# Patient Record
Sex: Female | Born: 1985
Health system: Southern US, Community
[De-identification: ages and names within clinical notes are randomized; demographics above are authoritative.]

## PROBLEM LIST (undated history)

## (undated) HISTORY — PX: TUBAL LIGATION: SHX77

---

## 2011-11-05 ENCOUNTER — Emergency Department (HOSPITAL_BASED_OUTPATIENT_CLINIC_OR_DEPARTMENT_OTHER)
Admission: EM | Admit: 2011-11-05 | Discharge: 2011-11-05 | Disposition: A | Discharge status: Home / Self Care | Payer: Self-pay | Attending: Emergency Medicine | Admitting: Emergency Medicine

## 2011-11-05 ENCOUNTER — Encounter (HOSPITAL_BASED_OUTPATIENT_CLINIC_OR_DEPARTMENT_OTHER): Payer: Self-pay | Admitting: *Deleted

## 2011-11-05 DIAGNOSIS — T148XXA Other injury of unspecified body region, initial encounter: Secondary | ICD-10-CM

## 2011-11-05 DIAGNOSIS — F17200 Nicotine dependence, unspecified, uncomplicated: Secondary | ICD-10-CM | POA: Insufficient documentation

## 2011-11-05 DIAGNOSIS — Y998 Other external cause status: Secondary | ICD-10-CM | POA: Insufficient documentation

## 2011-11-05 DIAGNOSIS — S41109A Unspecified open wound of unspecified upper arm, initial encounter: Secondary | ICD-10-CM | POA: Insufficient documentation

## 2011-11-05 DIAGNOSIS — Y9383 Activity, rough housing and horseplay: Secondary | ICD-10-CM | POA: Insufficient documentation

## 2011-11-05 DIAGNOSIS — W268XXA Contact with other sharp object(s), not elsewhere classified, initial encounter: Secondary | ICD-10-CM | POA: Insufficient documentation

## 2011-11-05 MED ORDER — IBUPROFEN 800 MG PO TABS
800.0000 mg | ORAL_TABLET | Freq: Once | ORAL | Status: AC
  Administered 2011-11-05: 800 mg via ORAL
  Filled 2011-11-05: qty 1

## 2011-11-05 MED ORDER — TETANUS-DIPHTH-ACELL PERTUSSIS 5-2.5-18.5 LF-MCG/0.5 IM SUSP
0.5000 mL | Freq: Once | INTRAMUSCULAR | Status: AC
  Administered 2011-11-05: 0.5 mL via INTRAMUSCULAR
  Filled 2011-11-05: qty 0.5

## 2011-11-05 NOTE — ED Provider Notes (Signed)
History     CSN: [id number]  Arrival date & time 11/05/11  2103   First MD Initiated Contact with Patient 11/05/11 2204      Chief Complaint  Patient presents with  . Puncture Wound    (Consider location/radiation/quality/duration/timing/severity/associated sxs/prior treatment) HPI Comments: Pt states that she was playing around with a friend and there was a nail sticking out of the door and she scraped her left upper arm with the nail:pt denies and pieces being in the area:pt unsure of last tetanus  The history is provided by the patient. No language interpreter was used.    History reviewed. No pertinent past medical history.  History reviewed. No pertinent past surgical history.  No family history on file.  History  Substance Use Topics  . Smoking status: Current Everyday Smoker -- 0.5 packs/day  . Smokeless tobacco: Not on file  . Alcohol Use: Yes    OB History    Grav Para Term Preterm Abortions TAB SAB Ect Mult Living                  Review of Systems  Constitutional: Negative.   Respiratory: Negative.   Skin: Positive for wound.    Allergies  Review of patient's allergies indicates no known allergies.  Home Medications   Current Outpatient Rx  Name Route Sig Dispense Refill  . MEDROXYPROGESTERONE ACETATE 150 MG/ML IM SUSP Intramuscular Inject 150 mg into the muscle every 3 (three) months.      BP 110/63  Pulse 65  Temp(Src) 98.6 F (37 C) (Oral)  Resp 20  SpO2 100%  Physical Exam  Nursing note and vitals reviewed. Constitutional: She appears well-developed and well-nourished.  Cardiovascular: Normal rate and regular rhythm.   Pulmonary/Chest: Effort normal and breath sounds normal.  Musculoskeletal: Normal range of motion.  Skin:       Pt has a small puncture wound to the left upper arm    ED Course  Procedures (including critical care time)  Labs Reviewed - No data to display No results found.   1. Puncture wound       MDM    There is nothing to suture at this time:no fb noted:wound cleaned and dressed:pt updated on tetanus       Teressa Lower, NP 11/05/11 2215
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
## 2011-11-08 NOTE — ED Provider Notes (Signed)
Medical screening examination/treatment/procedure(s) were performed by non-physician practitioner and as supervising physician I was immediately available for consultation/collaboration.  Zaylon Bossier, MD 11/08/11 0439 

## 2014-03-19 ENCOUNTER — Emergency Department (HOSPITAL_BASED_OUTPATIENT_CLINIC_OR_DEPARTMENT_OTHER)
Admission: EM | Admit: 2014-03-19 | Discharge: 2014-03-19 | Disposition: A | Discharge status: Home / Self Care | Payer: No Typology Code available for payment source | Attending: Emergency Medicine | Admitting: Emergency Medicine

## 2014-03-19 ENCOUNTER — Encounter (HOSPITAL_BASED_OUTPATIENT_CLINIC_OR_DEPARTMENT_OTHER): Payer: Self-pay | Admitting: Emergency Medicine

## 2014-03-19 ENCOUNTER — Emergency Department (HOSPITAL_BASED_OUTPATIENT_CLINIC_OR_DEPARTMENT_OTHER): Payer: No Typology Code available for payment source

## 2014-03-19 DIAGNOSIS — S39012A Strain of muscle, fascia and tendon of lower back, initial encounter: Secondary | ICD-10-CM

## 2014-03-19 DIAGNOSIS — Z720 Tobacco use: Secondary | ICD-10-CM | POA: Insufficient documentation

## 2014-03-19 DIAGNOSIS — S0990XA Unspecified injury of head, initial encounter: Secondary | ICD-10-CM | POA: Insufficient documentation

## 2014-03-19 DIAGNOSIS — Y92410 Unspecified street and highway as the place of occurrence of the external cause: Secondary | ICD-10-CM | POA: Insufficient documentation

## 2014-03-19 DIAGNOSIS — Y9389 Activity, other specified: Secondary | ICD-10-CM | POA: Insufficient documentation

## 2014-03-19 DIAGNOSIS — S161XXA Strain of muscle, fascia and tendon at neck level, initial encounter: Secondary | ICD-10-CM

## 2014-03-19 DIAGNOSIS — Z3202 Encounter for pregnancy test, result negative: Secondary | ICD-10-CM | POA: Insufficient documentation

## 2014-03-19 LAB — PREGNANCY, URINE: Preg Test, Ur: NEGATIVE
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
## 2014-03-19 NOTE — Discharge Instructions (Signed)
Ibuprofen 600 mg every 6 hours as needed for pain.  Follow-up with your primary doctor if not improving in the next week.   Cervical Sprain A cervical sprain is when the tissues (ligaments) that hold the neck bones in place stretch or tear. HOME CARE   Put ice on the injured area.  Put ice in a plastic bag.  Place a towel between your skin and the bag.  Leave the ice on for 15-20 minutes, 3-4 times a day.  You may have been given a collar to wear. This collar keeps your neck from moving while you heal.  Do not take the collar off unless told by your doctor.  If you have long hair, keep it outside of the collar.  Ask your doctor before changing the position of your collar. You may need to change its position over time to make it more comfortable.  If you are allowed to take off the collar for cleaning or bathing, follow your doctor's instructions on how to do it safely.  Keep your collar clean by wiping it with mild soap and water. Dry it completely. If the collar has removable pads, remove them every 1-2 days to hand wash them with soap and water. Allow them to air dry. They should be dry before you wear them in the collar.  Do not drive while wearing the collar.  Only take medicine as told by your doctor.  Keep all doctor visits as told.  Keep all physical therapy visits as told.  Adjust your work station so that you have good posture while you work.  Avoid positions and activities that make your problems worse.  Warm up and stretch before being active. GET HELP IF:  Your pain is not controlled with medicine.  You cannot take less pain medicine over time as planned.  Your activity level does not improve as expected. GET HELP RIGHT AWAY IF:   You are bleeding.  Your stomach is upset.  You have an allergic reaction to your medicine.  You develop new problems that you cannot explain.  You lose feeling (become numb) or you cannot move any part of your body  (paralysis).  You have tingling or weakness in any part of your body.  Your symptoms get worse. Symptoms include:  Pain, soreness, stiffness, puffiness (swelling), or a burning feeling in your neck.  Pain when your neck is touched.  Shoulder or upper back pain.  Limited ability to move your neck.  Headache.  Dizziness.  Your hands or arms feel week, lose feeling, or tingle.  Muscle spasms.  Difficulty swallowing or chewing. MAKE SURE YOU:   Understand these instructions.  Will watch your condition.  Will get help right away if you are not doing well or get worse. Document Released: 10/30/2007 Document Revised: 01/13/2013 Document Reviewed: 11/18/2012 [facility] Patient Information 2015 [place], [place]. This information is not intended to replace advice given to you by your health care provider. Make sure you discuss any questions you have with your health care provider.  Lumbosacral Strain Lumbosacral strain is a strain of any of the parts that make up your lumbosacral vertebrae. Your lumbosacral vertebrae are the bones that make up the lower third of your backbone. Your lumbosacral vertebrae are held together by muscles and tough, fibrous tissue (ligaments).  CAUSES  A sudden blow to your back can cause lumbosacral strain. Also, anything that causes an excessive stretch of the muscles in the low back can cause this strain. This is  typically seen when people exert themselves strenuously, fall, lift heavy objects, bend, or crouch repeatedly. RISK FACTORS  Physically demanding work.  Participation in pushing or pulling sports or sports that require a sudden twist of the back (tennis, golf, baseball).  Weight lifting.  Excessive lower back curvature.  Forward-tilted pelvis.  Weak back or abdominal muscles or both.  Tight hamstrings. SIGNS AND SYMPTOMS  Lumbosacral strain may cause pain in the area of your injury or pain that moves (radiates) down your leg.   DIAGNOSIS Your health care provider can often diagnose lumbosacral strain through a physical exam. In some cases, you may need tests such as X-ray exams.  TREATMENT  Treatment for your lower back injury depends on many factors that your clinician will have to evaluate. However, most treatment will include the use of anti-inflammatory medicines. HOME CARE INSTRUCTIONS   Avoid hard physical activities (tennis, racquetball, waterskiing) if you are not in proper physical condition for it. This may aggravate or create problems.  If you have a back problem, avoid sports requiring sudden body movements. Swimming and walking are generally safer activities.  Maintain good posture.  Maintain a healthy weight.  For acute conditions, you may put ice on the injured area.  Put ice in a plastic bag.  Place a towel between your skin and the bag.  Leave the ice on for 20 minutes, 2-3 times a day.  When the low back starts healing, stretching and strengthening exercises may be recommended. SEEK MEDICAL CARE IF:  Your back pain is getting worse.  You experience severe back pain not relieved with medicines. SEEK IMMEDIATE MEDICAL CARE IF:   You have numbness, tingling, weakness, or problems with the use of your arms or legs.  There is a change in bowel or bladder control.  You have increasing pain in any area of the body, including your belly (abdomen).  You notice shortness of breath, dizziness, or feel faint.  You feel sick to your stomach (nauseous), are throwing up (vomiting), or become sweaty.  You notice discoloration of your toes or legs, or your feet get very cold. MAKE SURE YOU:   Understand these instructions.  Will watch your condition.  Will get help right away if you are not doing well or get worse. Document Released: 02/20/2005 Document Revised: 05/18/2013 Document Reviewed: 12/30/2012 [facility] Patient Information 2015 [place], [place]. This information is not intended to  replace advice given to you by your health care provider. Make sure you discuss any questions you have with your health care provider.
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
## 2014-03-19 NOTE — ED Notes (Signed)
Pt discharged to home with family. NAD.  

## 2014-07-28 ENCOUNTER — Encounter (HOSPITAL_BASED_OUTPATIENT_CLINIC_OR_DEPARTMENT_OTHER): Payer: Self-pay

## 2014-07-28 ENCOUNTER — Emergency Department (HOSPITAL_BASED_OUTPATIENT_CLINIC_OR_DEPARTMENT_OTHER)
Admission: EM | Admit: 2014-07-28 | Discharge: 2014-07-28 | Disposition: A | Discharge status: Home / Self Care | Payer: Self-pay | Attending: Emergency Medicine | Admitting: Emergency Medicine

## 2014-07-28 DIAGNOSIS — Z720 Tobacco use: Secondary | ICD-10-CM | POA: Insufficient documentation

## 2014-07-28 DIAGNOSIS — J111 Influenza due to unidentified influenza virus with other respiratory manifestations: Secondary | ICD-10-CM | POA: Insufficient documentation

## 2014-07-28 DIAGNOSIS — R69 Illness, unspecified: Secondary | ICD-10-CM

## 2014-07-28 LAB — CBG MONITORING, ED: Glucose-Capillary: 77 mg/dL (ref 70–99)

## 2014-07-28 MED ORDER — ACETAMINOPHEN 500 MG PO TABS
1000.0000 mg | ORAL_TABLET | Freq: Once | ORAL | Status: AC
  Administered 2014-07-28: 1000 mg via ORAL
  Filled 2014-07-28: qty 2

## 2014-07-28 NOTE — ED Notes (Signed)
MD at bedside. 

## 2014-07-28 NOTE — ED Provider Notes (Signed)
CSN: [id number]     Arrival date & time 07/28/14  [id number] History   First MD Initiated Contact with Patient 07/28/14 [phone]     Chief Complaint  Patient presents with  . Generalized Body Aches     (Consider location/radiation/quality/duration/timing/severity/associated sxs/prior Treatment) HPI Complains of left-sided frontoparietal headache typical of migraine, gradual onset, generalized weakness, duct of cough, diffuse myalgias onset yesterday. Treated with NyQuil last night, without relief. No shortness of breath no abdominal pain no nausea or vomiting no fever no other associated symptoms. Nothing makes symptoms better or worse. History reviewed. No pertinent past medical history. past medical history negative History reviewed. No pertinent past surgical history. No family history on file. History  Substance Use Topics  . Smoking status: Current Every Day Smoker -- 0.50 packs/day  . Smokeless tobacco: Not on file  . Alcohol Use: Yes   positive marijuana use OB History    No data available     Review of Systems  Respiratory: Positive for cough. Negative for shortness of breath.   Genitourinary:       Currently on menses  Musculoskeletal: Positive for myalgias.  Neurological: Positive for weakness and headaches.  All other systems reviewed and are negative.     Allergies  Review of patient's allergies indicates no known allergies.  Home Medications   Prior to Admission medications   Medication Sig Start Date End Date Taking? Authorizing Provider  medroxyPROGESTERone (DEPO-PROVERA) 150 MG/ML injection Inject 150 mg into the muscle every 3 (three) months.    Historical Provider, MD   BP 104/77 mmHg  Pulse 64  Temp(Src) 98.5 F (36.9 C) (Oral)  Resp 16  Ht 5\' 4"  (1.626 m)  Wt 154 lb (69.854 kg)  BMI 26.42 kg/m2  SpO2 100%  LMP 07/26/2014 Physical Exam  Constitutional: She appears well-developed and well-nourished. No distress.  HENT:  Head: Normocephalic and  atraumatic.  Right Ear: External ear normal.  Left Ear: External ear normal.  Mouth/Throat: No oropharyngeal exudate.  Oral pharynx minimally reddened, nasal congestion  Eyes: Conjunctivae are normal. Pupils are equal, round, and reactive to light.  Neck: Neck supple. No tracheal deviation present. No thyromegaly present.  Cardiovascular: Normal rate and regular rhythm.   No murmur heard. Pulmonary/Chest: Effort normal and breath sounds normal.  Abdominal: Soft. Bowel sounds are normal. She exhibits no distension. There is no tenderness.  Musculoskeletal: Normal range of motion. She exhibits no edema or tenderness.  Neurological: She is alert. Coordination normal.  Skin: Skin is warm and dry. No rash noted.  Psychiatric: She has a normal mood and affect.  Nursing note and vitals reviewed.   ED Course  Procedures (including critical care time) Labs Review Labs Reviewed - No data to display  Imaging Review No results found.   EKG Interpretation None     Counseled patient for 5 minutes on smoking cessation Results for orders placed or performed during the hospital encounter of 07/28/14  CBG monitoring, ED  Result Value Ref Range   Glucose-Capillary 77 70 - 99 mg/dL   No results found.  MDM  8:05 AM patient looks well. Alert and ambulatory. Suspect viral illness. Final diagnoses:  None   plan encourage oral hydration. Tylenol for fever or aches. Referral [facility] and wellness and resource guide Diagnosis #1 influenza-like illness #2 tobacco abuse      Doug SouSam Kiaya [place], MD 07/28/14 [phone]

## 2014-07-28 NOTE — Discharge Instructions (Signed)
Take Tylenol every 4 hours as directed for aches or for temperature higher than 100.4 while awake. Make sure that you drink lots of fluids. Drink at least six 8 ounce glasses of water or Gatorade each day. Call the [facility] health and wellness Center or any of the numbers on the resource guide to get a primary care physician today. See your new primary care physician if you're not feeling better by next week. Return if you feel worse for any reason. Ask your new doctor to help you to stop smoking.  Emergency Department Resource Guide 1) Find a Doctor and Pay Out of Pocket Although you won't have to find out who is covered by your insurance plan, it is a good idea to ask around and get recommendations. You will then need to call the office and see if the doctor you have chosen will accept you as a new patient and what types of options they offer for patients who are self-pay. Some doctors offer discounts or will set up payment plans for their patients who do not have insurance, but you will need to ask so you aren't surprised when you get to your appointment.  2) Contact Your Local Health Department Not all health departments have doctors that can see patients for sick visits, but many do, so it is worth a call to see if yours does. If you don't know where your local health department is, you can check in your phone book. The CDC also has a tool to help you locate your state's health department, and many state websites also have listings of all of their local health departments.  3) Find a Walk-in Clinic If your illness is not likely to be very severe or complicated, you may want to try a walk in clinic. These are popping up all over the country in pharmacies, drugstores, and shopping centers. They're usually staffed by nurse practitioners or physician assistants that have been trained to treat common illnesses and complaints. They're usually fairly quick and inexpensive. However, if you have serious  medical issues or chronic medical problems, these are probably not your best option.  No Primary Care Doctor: - Call Health Connect at  [phone] - they can help you locate a primary care doctor that  accepts your insurance, provides certain services, etc. - Physician Referral Service- [phone]  Chronic Pain Problems: Organization         Address  Phone   Notes  [facility]  [phone] Patients need to be referred by their primary care doctor.   Medication Assistance: Organization         Address  Phone   Notes  [facility] Medication [facility] [address] [place], [place] [address] [phone] --Must be a resident of [facility] -- Must have NO insurance coverage whatsoever (no Medicaid/ Medicare, etc.) -- The pt. MUST have a primary care doctor that directs their care regularly and follows them in the community   [facility]  [phone]   [employer]  [phone]    Agencies that provide inexpensive medical care: Organization         Address  Phone   Notes  [facility]  [phone]   [facility]    [phone]   [facility] [address] [place], [place] [address] [phone]   [facility] [address] [place], [place] [phone]  Planned Parenthood    [phone](336) [phone]   [facility]    [phone]) [phone]   [facility] and [facility]  [address], [place] Phone:  [phone], Fax:  [phone] Hours of Operation:  9 am - 6 pm, M-F.  Also accepts Medicaid/Medicare and self-pay.  [facility]  [address], [place] Phone: [phone](336) [phone], Fax: [phone]) [phone]. Hours of Operation:  8:30 am - 5:30 pm, M-F.  Also accepts Medicaid and self-pay.  [facility] [place] [address], [place] Phone: [phone]) [phone]   [facility] [address], [place], [place] [phone], Ext. [phone] Mondays & Thursdays: 7-9 AM.  First 15 patients are seen on a first come, first serve basis.    Medicaid-accepting [place] Providers:  Organization         Address  Phone   Notes  [facility] [address], [place] [phone] Also accepts self-pay patients.  [facility] [address], [place]  [phone]) [phone]   [facility] [address], [place] [phone]) [phone]   [facility] [address] [place] [address], [place] [phone]) [phone]   Renaye RakersVeita Bland [address], [place]   [phone]) [phone] Only accepts [place] Access [place] patients after they have their name applied to their card.   Self-Pay (no insurance) in [facility]:  Organization         Address  Phone   Notes  Sickle Cell Patients, [facility] [address], [place] [phone]) [phone]   [facility] [address], [place] [phone]   [facility]  [address], [place] [phone]) [phone]   [facility]/Dr. Osei-Bonsu  [address] [place] [address], [place] or [phone], [place] [phone]) [phone] Phone number for both [place] and [place] locations is the same.  [facility] [address], [place] [phone]) [phone]   [facility]  [address] [place] [address], [place] or [address] [phone]) [phone]   [facility] [address], [place] [phone]) [phone], phone; [phone]) [phone], fax Sees patients 1st and 3rd Saturday of every month.  Must not qualify for public or private insurance (i.e. Medicaid, Medicare, [place] Health Choice, Veterans' Benefits)  Household income should be no more than 200% of the poverty level The clinic cannot treat you if you are pregnant or think you are pregnant  Sexually  transmitted diseases are not treated at the clinic.    Dental Care: Organization         Address  Phone  Notes  [facility] [address], [place] [phone]) [phone] Accepts children up to age 27 who are enrolled in [place] or [place] Health Choice; pregnant women with a Medicaid card; and children who have applied for Medicaid or [place] Health Choice, but were declined, whose parents can pay a reduced fee at time of service.  [facility] [place]  [address], [place] [phone]) [phone] Accepts children up to age 27 who are enrolled in [place] or [place] Health Choice; pregnant women with a Medicaid card; and children who have applied for Medicaid or  Health Choice, but were declined,  whose parents can pay a reduced fee at time of service.  [facility]  [address] [place], [place] [phone] Patients are seen by appointment only. Walk-ins are not accepted. [facility] will see patients 79 years of age and older. Monday - Tuesday (8am-5pm) Most Wednesdays (8:30-5pm) $30 per visit, cash only  [facility]  [address], [facility] [phone] Patients are seen by appointment only. Walk-ins are not accepted. [facility] will see patients 45 years of age and older. One Wednesday Evening (Monthly: Volunteer Based).  $30 per visit, cash only  [employer]  [phone] for adults; Children under age 25, call Graduate Pediatric Dentistry at [phone]. Children aged 75-14, please call [phone] to request a pediatric application.  Dental services are provided in all areas of dental care including fillings, crowns and bridges, complete and partial dentures, implants, gum treatment, root canals, and extractions. Preventive care is also provided. Treatment is provided to both adults and children. Patients are  selected via a lottery and there is often a waiting list.   [facility] [address] [place]  [phone] [email or web address]   [facility] [address] [place], [place] [phone], Ext. [phone] Second and Fourth Thursday of each month, opens at 6:30 AM; Clinic ends at 9 AM.  Patients are seen on a first-come first-served basis, and a limited number are seen during each clinic.   [facility]  [address] [place], [place] [phone]   Eligibility Requirements You must have lived in [place], [place], or [place] counties for at least the last three months.   You cannot be eligible for state or federal sponsored [place], including [employer], [place], or [employer].   You generally cannot be eligible for healthcare insurance through your employer.    How to apply: Eligibility screenings are held every Tuesday and Wednesday afternoon from 1:00 pm until 4:00 pm. You do not need an appointment for the interview!  [facility] [address] [place], [place] [phone]   [facility]  [phone]   [facility]  [phone]   [facility]  [phone]    Behavioral Health Resources in the Community: Intensive Outpatient Programs Organization         Address  Phone  Notes  [facility] [address] [place], [place] [phone]   [facility] Outpatient [address] [place], [place] [phone]   [facility] [address] [place], [place]  [phone]   [facility] [address],  [place], [place] [phone] or [phone]   Substance Abuse Resources Organization         Address  Phone  Notes  Al[facility] Drug Services  [phone]   [facility]  [phone]   [facility] [place]  [phone]   Floydene Flock  [phone]     Residential & Outpatient Substance Abuse Program  [phone]   Psychological Services Organization         Address  Phone  Notes  [facility]  [phone]   [facility]  [phone]   [facility] Mental Health [address] [place] [phone] or [phone]    Mobile Crisis Teams Organization         Address  Phone  Notes  [facility],  Mobile Crisis Care Unit  [phone]   [facility]  [address] [place], [place] [phone]   [facility] [address] [place] [phone]    Self-Help/Support Groups Organization         Address  Phone             Notes  [facility] [place] - variety of support groups  [phone]- [phone] Call for more information  Narcotics Anonymous (NA), [facility] [address], [employer] [place]  2 meetings at this location   Statistician         Address  Phone  Notes  [facility] [address],    [place]  [phone]   [facility]  [address], [place] [address], [place], [place] [phone]   [facility] Treatment Facility [address] [place], [place] [phone] Admissions: 8am-3pm M-F  [facility] [address],    [place], [place] [phone]   [facility] [address] [place], [place], [place] [phone]   [facility] [address],  [place], [place] [phone]   [facility] - Intensive Outpatient [address], [place], [place] [phone]   [facility] [address]  [place], [place] [phone] or [phone]   [facility] ([facility]) [address] [place], [place] [phone] Accepts Medicaid  [facility] [address]  [place] [phone] Substance Abuse/Addiction Treatment   [facility] Organization         Address  Phone  Notes  [facility]  [phone]   Angie Fava, PhD [address] [place], [place]   [phone] or [phone]   [facility]   [address] [place], [place] [phone]   [facility] [address] [place], [place] [phone] Insurance/Medicaid/sponsorship through [facility] and Families [address]                                    [place], [place] [phone] Therapy/tele-psych/case  [facility] [address] [place], [place] [phone]    Dr. Lolly Mustache  [phone]   [facility] [place]  [employer] [facility] Dept. 1) [address], [place] 2) [address], [place] 3)  [address], [place] [phone]  [phone]   [facility] Child Abuse Hotline 540-025-1154 or [phone] (After Hours)

## 2014-07-28 NOTE — ED Notes (Signed)
Reports headache, bodyaches, weakness since yesterdays. Denies fevers or chills. Reports cough. Denies dysuria.

## 2014-10-02 ENCOUNTER — Emergency Department (HOSPITAL_BASED_OUTPATIENT_CLINIC_OR_DEPARTMENT_OTHER)
Admission: EM | Admit: 2014-10-02 | Discharge: 2014-10-02 | Disposition: A | Discharge status: Home / Self Care | Payer: Self-pay | Attending: Emergency Medicine | Admitting: Emergency Medicine

## 2014-10-02 ENCOUNTER — Encounter (HOSPITAL_BASED_OUTPATIENT_CLINIC_OR_DEPARTMENT_OTHER): Payer: Self-pay | Admitting: *Deleted

## 2014-10-02 DIAGNOSIS — M546 Pain in thoracic spine: Secondary | ICD-10-CM | POA: Insufficient documentation

## 2014-10-02 DIAGNOSIS — Z720 Tobacco use: Secondary | ICD-10-CM | POA: Insufficient documentation

## 2014-10-02 DIAGNOSIS — Z3202 Encounter for pregnancy test, result negative: Secondary | ICD-10-CM | POA: Insufficient documentation

## 2014-10-02 LAB — PREGNANCY, URINE: Preg Test, Ur: NEGATIVE

## 2014-10-02 MED ORDER — OXYCODONE-ACETAMINOPHEN 5-325 MG PO TABS: 1.0000 | ORAL_TABLET | Freq: Four times a day (QID) | ORAL | Status: DC | PRN

## 2014-10-02 MED ORDER — OXYCODONE-ACETAMINOPHEN 5-325 MG PO TABS
1.0000 | ORAL_TABLET | Freq: Once | ORAL | Status: AC
  Administered 2014-10-02: 1 via ORAL
  Filled 2014-10-02: qty 1

## 2014-10-02 MED ORDER — IBUPROFEN 600 MG PO TABS: 600.0000 mg | ORAL_TABLET | Freq: Four times a day (QID) | ORAL | Status: DC | PRN

## 2014-10-02 NOTE — ED Provider Notes (Signed)
CSN: [id number]     Arrival date & time 10/02/14  0902 History   First MD Initiated Contact with Patient 10/02/14 [phone]     Chief Complaint  Patient presents with  . Neck Pain     (Consider location/radiation/quality/duration/timing/severity/associated sxs/prior Treatment) Patient is a 29 y.o. female presenting with neck pain. The history is provided by the patient.  Neck Pain Associated symptoms: no chest pain and no fever    patient presents with pain in her mid upper back. Worse with movement. Began yesterday. Worse with breathing. No fevers. No history of back pains. No numbness or weakness. No difficult breathing. She states that she did have some crampy abdominal pain earlier today. She states that resolved after she went to the bathroom.  History reviewed. No pertinent past medical history. History reviewed. No pertinent past surgical history. No family history on file. History  Substance Use Topics  . Smoking status: Current Every Day Smoker -- 0.50 packs/day    Types: Cigarettes  . Smokeless tobacco: Never Used  . Alcohol Use: Yes     Comment: rare   OB History    No data available     Review of Systems  Constitutional: Negative for fever.  Respiratory: Negative for shortness of breath.   Cardiovascular: Negative for chest pain.  Gastrointestinal: Negative for nausea and vomiting.  Musculoskeletal: Positive for back pain. Negative for neck pain.      Allergies  Review of patient's allergies indicates no known allergies.  Home Medications   Prior to Admission medications   Medication Sig Start Date End Date Taking? Authorizing Provider  ibuprofen (ADVIL,MOTRIN) 600 MG tablet Take 1 tablet (600 mg total) by mouth every 6 (six) hours as needed. 10/02/14   Benjiman CoreNathan Shakina Choy, MD  medroxyPROGESTERone (DEPO-PROVERA) 150 MG/ML injection Inject 150 mg into the muscle every 3 (three) months.    Historical Provider, MD  oxyCODONE-acetaminophen (PERCOCET/ROXICET) 5-325 MG per  tablet Take 1-2 tablets by mouth every 6 (six) hours as needed for severe pain. 10/02/14   Benjiman CoreNathan Zhaniya Swallows, MD   BP 123/79 mmHg  Pulse 80  Temp(Src) 98.2 F (36.8 C)  Resp 18  Ht 5\' 4"  (1.626 m)  Wt 145 lb (65.772 kg)  BMI 24.88 kg/m2  SpO2 100%  LMP 09/24/2014 Physical Exam  Constitutional: She appears well-developed.  Neck: Normal range of motion. Neck supple.  Cardiovascular: Normal rate and regular rhythm.   Musculoskeletal: She exhibits tenderness.  Tenderness over upper thoracic spine medially. No step-off deformity. No erythema. No pain with movement of cervical spine with limiting of the movement of her cervical spine. Some pain in the upper back with raising of the legs.  Neurological: She is alert.  Skin: Skin is warm.    ED Course  Procedures (including critical care time) Labs Review Labs Reviewed  PREGNANCY, URINE    Imaging Review No results found.   EKG Interpretation None      MDM   Final diagnoses:  Midline thoracic back pain    Patient with mid back pain. Appears to be musculoskeletal. Nothing she does imaging at this time. Will treat with anti-inflammatories and some pain medicines. Will have follow-up with sports medicine if the pain does not improve.    Benjiman CoreNathan Daneisha Surges, MD 10/02/14 1004

## 2014-10-02 NOTE — ED Notes (Signed)
Pt reports upper back and neck pain x 1 day- started yesterday, worse today- pain with bending chin to chest- denies injury, exercise, falls, fever or other illness- also reports her stomach was "hurting real bad" earlier this morning but that has resolved

## 2014-10-02 NOTE — ED Notes (Signed)
MD at bedside. 

## 2014-10-02 NOTE — ED Notes (Signed)
rx x 2 given for percocet and ibuprofen- d/c home with ride

## 2014-10-02 NOTE — Discharge Instructions (Signed)
Back Pain, Adult Low back pain is very common. About 1 in 5 people have back pain.The cause of low back pain is rarely dangerous. The pain often gets better over time.About half of people with a sudden onset of back pain feel better in just 2 weeks. About 8 in 10 people feel better by 6 weeks.  CAUSES Some common causes of back pain include:  Strain of the muscles or ligaments supporting the spine.  Wear and tear (degeneration) of the spinal discs.  Arthritis.  Direct injury to the back. DIAGNOSIS Most of the time, the direct cause of low back pain is not known.However, back pain can be treated effectively even when the exact cause of the pain is unknown.Answering your caregiver's questions about your overall health and symptoms is one of the most accurate ways to make sure the cause of your pain is not dangerous. If your caregiver needs more information, he or she may order lab work or imaging tests (X-rays or MRIs).However, even if imaging tests show changes in your back, this usually does not require surgery. HOME CARE INSTRUCTIONS For many people, back pain returns.Since low back pain is rarely dangerous, it is often a condition that people can learn to manageon their own.   Remain active. It is stressful on the back to sit or stand in one place. Do not sit, drive, or stand in one place for more than 30 minutes at a time. Take short walks on level surfaces as soon as pain allows.Try to increase the length of time you walk each day.  Do not stay in bed.Resting more than 1 or 2 days can delay your recovery.  Do not avoid exercise or work.Your body is made to move.It is not dangerous to be active, even though your back may hurt.Your back will likely heal faster if you return to being active before your pain is gone.  Pay attention to your body when you bend and lift. Many people have less discomfortwhen lifting if they bend their knees, keep the load close to their bodies,and  avoid twisting. Often, the most comfortable positions are those that put less stress on your recovering back.  Find a comfortable position to sleep. Use a firm mattress and lie on your side with your knees slightly bent. If you lie on your back, put a pillow under your knees.  Only take over-the-counter or prescription medicines as directed by your caregiver. Over-the-counter medicines to reduce pain and inflammation are often the most helpful.Your caregiver may prescribe muscle relaxant drugs.These medicines help dull your pain so you can more quickly return to your normal activities and healthy exercise.  Put ice on the injured area.  Put ice in a plastic bag.  Place a towel between your skin and the bag.  Leave the ice on for 15-20 minutes, 03-04 times a day for the first 2 to 3 days. After that, ice and heat may be alternated to reduce pain and spasms.  Ask your caregiver about trying back exercises and gentle massage. This may be of some benefit.  Avoid feeling anxious or stressed.Stress increases muscle tension and can worsen back pain.It is important to recognize when you are anxious or stressed and learn ways to manage it.Exercise is a great option. SEEK MEDICAL CARE IF:  You have pain that is not relieved with rest or medicine.  You have pain that does not improve in 1 week.  You have new symptoms.  You are generally not feeling well. SEEK   IMMEDIATE MEDICAL CARE IF:   You have pain that radiates from your back into your legs.  You develop new bowel or bladder control problems.  You have unusual weakness or numbness in your arms or legs.  You develop nausea or vomiting.  You develop abdominal pain.  You feel faint. Document Released: 05/13/2005 Document Revised: 11/12/2011 Document Reviewed: 09/14/2013 ExitCare Patient Information 2015 [employer]. This information is not intended to replace advice given to you by your health care provider. Make sure you  discuss any questions you have with your health care provider.  

## 2014-10-03 ENCOUNTER — Encounter (HOSPITAL_BASED_OUTPATIENT_CLINIC_OR_DEPARTMENT_OTHER): Payer: Self-pay

## 2014-10-03 ENCOUNTER — Emergency Department (HOSPITAL_BASED_OUTPATIENT_CLINIC_OR_DEPARTMENT_OTHER)
Admission: EM | Admit: 2014-10-03 | Discharge: 2014-10-03 | Discharge status: Against Medical Advice | Payer: Self-pay | Attending: Emergency Medicine | Admitting: Emergency Medicine

## 2014-10-03 DIAGNOSIS — Z720 Tobacco use: Secondary | ICD-10-CM | POA: Insufficient documentation

## 2014-10-03 DIAGNOSIS — M546 Pain in thoracic spine: Secondary | ICD-10-CM | POA: Insufficient documentation

## 2014-10-03 NOTE — ED Notes (Signed)
Reports continued pain to upper back. Sts percocet isn't working. Pt ambulatory with no problems.

## 2015-06-05 ENCOUNTER — Encounter (HOSPITAL_BASED_OUTPATIENT_CLINIC_OR_DEPARTMENT_OTHER): Payer: Self-pay | Admitting: Emergency Medicine

## 2015-06-05 ENCOUNTER — Emergency Department (HOSPITAL_BASED_OUTPATIENT_CLINIC_OR_DEPARTMENT_OTHER)
Admission: EM | Admit: 2015-06-05 | Discharge: 2015-06-05 | Disposition: A | Discharge status: Home / Self Care | Payer: Medicaid Other | Attending: Emergency Medicine | Admitting: Emergency Medicine

## 2015-06-05 DIAGNOSIS — H9202 Otalgia, left ear: Secondary | ICD-10-CM | POA: Insufficient documentation

## 2015-06-05 DIAGNOSIS — F17210 Nicotine dependence, cigarettes, uncomplicated: Secondary | ICD-10-CM | POA: Insufficient documentation

## 2015-06-05 DIAGNOSIS — K0889 Other specified disorders of teeth and supporting structures: Secondary | ICD-10-CM | POA: Diagnosis present

## 2015-06-05 DIAGNOSIS — K029 Dental caries, unspecified: Secondary | ICD-10-CM | POA: Diagnosis not present

## 2015-06-05 MED ORDER — PENICILLIN V POTASSIUM 500 MG PO TABS: 500.0000 mg | ORAL_TABLET | Freq: Three times a day (TID) | ORAL | Status: DC

## 2015-06-05 MED ORDER — TRAMADOL HCL 50 MG PO TABS: 50.0000 mg | ORAL_TABLET | Freq: Four times a day (QID) | ORAL | Status: DC | PRN

## 2015-06-05 MED ORDER — NAPROXEN 500 MG PO TABS: 500.0000 mg | ORAL_TABLET | Freq: Two times a day (BID) | ORAL | Status: DC

## 2015-06-05 NOTE — Discharge Instructions (Signed)
Please read and follow all provided instructions.  Your diagnoses today include:  1. Pain due to dental caries    The exam and treatment you received today has been provided on an emergency basis only. This is not a substitute for complete medical or dental care.  Tests performed today include:  Vital signs. See below for your results today.   Medications prescribed:   Naproxen - anti-inflammatory pain medication  Do not exceed 500mg  naproxen every 12 hours, take with food  You have been prescribed an anti-inflammatory medication or NSAID. Take with food. Take smallest effective dose for the shortest duration needed for your pain. Stop taking if you experience stomach pain or vomiting.    Tramadol - narcotic-like pain medication  DO NOT drive or perform any activities that require you to be awake and alert because this medicine can make you drowsy.    Penicillin - antibiotic  You have been prescribed an antibiotic medicine: take the entire course of medicine even if you are feeling better. Stopping early can cause the antibiotic not to work.  Take any prescribed medications only as directed.  Home care instructions:  Follow any educational materials contained in this packet.  Follow-up instructions: Please follow-up with your dentist for further evaluation of your symptoms.   Dental Assistance: See below for dental referrals  Return instructions:   Please return to the Emergency Department if you experience worsening symptoms.  Please return if you develop a fever, you develop more swelling in your face or neck, you have trouble breathing or swallowing food.  Please return if you have any other emergent concerns.  Additional Information:  Your vital signs today were: BP 112/67 mmHg   Pulse 82   Temp(Src) 98.5 F (36.9 C) (Oral)   Resp 18   Ht 5\' 4"  (1.626 m)   Wt 70.761 kg   BMI 26.76 kg/m2   SpO2 99%   LMP 05/15/2015 If your blood pressure (BP) was elevated above  135/85 this visit, please have this repeated by your doctor within one month. -------------- Dental Care: Organization         Address  Phone  Notes  [facility] Department of [facility] [address] [place], [place] [phone] Accepts children up to age 63 who are enrolled in [place] or [place] Health Choice; pregnant women with a Medicaid card; and children who have applied for Medicaid or [place] Health Choice, but were declined, whose parents can pay a reduced fee at time of service.  [facility] Department of [facility]  [address], [place] [phone] Accepts children up to age 66 who are enrolled in [place] or [place] Health Choice; pregnant women with a Medicaid card; and children who have applied for Medicaid or [place] Health Choice, but were declined, whose parents can pay a reduced fee at time of service.  [facility]  [address] [place], [place] [phone] Patients are seen by appointment only. Walk-ins are not accepted. [facility] will see patients 34 years of age and older. Monday - Tuesday (8am-5pm) Most Wednesdays (8:30-5pm) $30 per visit, cash only  [facility]  [address], [facility] [phone] Patients are seen by appointment only. Walk-ins are not accepted. [facility] will see patients 18 years of age and older. One Wednesday Evening (Monthly: Volunteer Based).  $30 per visit, cash only  [employer]  (  [phone] for adults; Children under age 894, call Graduate Pediatric Dentistry at [phone]. Children aged 514-14, please call [phone] to request a pediatric application.  Dental services are provided in all areas of dental care including fillings, crowns and bridges, complete and partial dentures, implants, gum treatment, root canals, and extractions. Preventive care is also provided. Treatment is provided to both  adults and children. Patients are selected via a lottery and there is often a waiting list.   [facility] [address], [place]  [phone] [email or web address]   [facility] [address], [place], [place] [phone], Ext. [phone] Second and Fourth Thursday of each month, opens at 6:30 AM; Clinic ends at 9 AM.  Patients are seen on a first-come first-served basis, and a limited number are seen during each clinic.   [facility]  [address], [place], [place] [phone]   Eligibility Requirements You must have lived in [place], [place], or [place] for at least the last three months.   You cannot be eligible for state or federal sponsored [place] insurance, including [employer], [place], or [employer]are.   You generally cannot be eligible for healthcare insurance through your employer.    How to apply: Eligibility screenings are held every Tuesday and Wednesday afternoon from 1:00 pm until 4:00 pm. You do not need an appointment for the interview!  [facility] [address], [place], [place] [phone]   [facility]  [phone]   [facility]  [phone]   [facility]  [phone]

## 2015-06-05 NOTE — ED Provider Notes (Signed)
CSN: [id number]     Arrival date & time 06/05/15  1005 History   First MD Initiated Contact with Patient 06/05/15 1013     Chief Complaint  Patient presents with  . Dental Pain     (Consider location/radiation/quality/duration/timing/severity/associated sxs/prior Treatment) HPI Comments: Patient with a complaint of left lower dental pain for the past 2 days in area of the cavity which she noticed approximately month ago. Patient states that the area is becoming worse. Pain radiates to her ear. No neck swelling, facial swelling, trouble breathing or swallowing. Patient has taken aspirin without relief. Aggravating factors: none. Alleviating factors: none.    Patient is a [age] y.o. female presenting with tooth pain. The history is provided by the patient.  Dental Pain Associated symptoms: no facial swelling, no fever, no headaches and no neck pain     History reviewed. No pertinent past medical history. History reviewed. No pertinent past surgical history. History reviewed. No pertinent family history. Social History  Substance Use Topics  . Smoking status: Current Every Day Smoker -- 0.50 packs/day    Types: Cigarettes  . Smokeless tobacco: Never Used  . Alcohol Use: Yes     Comment: rare   OB History    No data available     Review of Systems  Constitutional: Negative for fever.  HENT: Positive for dental problem and ear pain. Negative for facial swelling, sore throat and trouble swallowing.   Respiratory: Negative for shortness of breath and stridor.   Musculoskeletal: Negative for neck pain.  Skin: Negative for color change.  Neurological: Negative for headaches.    Allergies  Review of patient's allergies indicates no known allergies.  Home Medications   Prior to Admission medications   Medication Sig Start Date End Date Taking? Authorizing Provider  naproxen (NAPROSYN) 500 MG tablet Take 1 tablet (500 mg total) by mouth 2 (two) times daily. 06/05/15   Renne CriglerJoshua Varvara Legault,  PA-C  penicillin v potassium (VEETID) 500 MG tablet Take 1 tablet (500 mg total) by mouth 3 (three) times daily. 06/05/15   Renne CriglerJoshua Kenyon Eshleman, PA-C  traMADol (ULTRAM) 50 MG tablet Take 1 tablet (50 mg total) by mouth every 6 (six) hours as needed. 06/05/15   Renne CriglerJoshua Ollen Rao, PA-C   BP 112/67 mmHg  Pulse 82  Temp(Src) 98.5 F (36.9 C) (Oral)  Resp 18  Ht 5\' 4"  (1.626 m)  Wt 70.761 kg  BMI 26.76 kg/m2  SpO2 99%  LMP 05/15/2015   Physical Exam  Constitutional: She appears well-developed and well-nourished.  HENT:  Head: Normocephalic and atraumatic.  Right Ear: Tympanic membrane, external ear and ear canal normal.  Left Ear: Tympanic membrane, external ear and ear canal normal.  Nose: Nose normal.  Mouth/Throat: Uvula is midline, oropharynx is clear and moist and mucous membranes are normal. No trismus in the jaw. Abnormal dentition. Dental caries present. No dental abscesses or uvula swelling. No tonsillar abscesses.  Patient with a dental carie noted on the buchal aspect of tooth #18. No gross abscess noted. No obvious infection. Previous extraction of third molar. Patient also has a small carie noted on tooth #19.  Eyes: Conjunctivae are normal.  Neck: Normal range of motion. Neck supple.  No neck swelling or Ludwig's angina  Lymphadenopathy:    She has no cervical adenopathy.  Neurological: She is alert.  Skin: Skin is warm and dry.  Psychiatric: She has a normal mood and affect.  Nursing note and vitals reviewed.   ED Course  Procedures (including  critical care time) Labs Review Labs Reviewed - No data to display  Imaging Review No results found. I have personally reviewed and evaluated these images and lab results as part of my medical decision-making.   EKG Interpretation None       10:45 AM Patient seen and examined. Medications ordered.   Vital signs reviewed and are as follows: BP 112/67 mmHg  Pulse 82  Temp(Src) 98.5 F (36.9 C) (Oral)  Resp 18  Ht 5\' 4"  (1.626  m)  Wt 70.761 kg  BMI 26.76 kg/m2  SpO2 99%  LMP 05/15/2015  Patient counseled on use of narcotic pain medications. Counseled not to combine these medications with others containing tylenol. Urged not to drink alcohol, drive, or perform any other activities that requires focus while taking these medications. The patient verbalizes understanding and agrees with the plan.  Rx penicillin given. Patient asked only to fill this if she develops any facial swelling.   Patient counseled to take prescribed medications as directed, return with worsening facial or neck swelling, and to follow-up with their dentist as soon as possible.    MDM   Final diagnoses:  Pain due to dental caries   Patient with toothache. No fever. Exam unconcerning for Ludwig's angina or other deep tissue infection in neck.   As there is no facial swelling or gum findings, will withhold abx unless facial swelling develops. Will treat with pain medication.       Renne Crigler, PA-C 06/05/15 1047  Azalia Bilis, MD 06/05/15 1314

## 2015-06-05 NOTE — ED Notes (Signed)
Patient stable and ambulatory.  Patient verbalizes understanding of discharge medications, instructions and follow-up. 

## 2015-06-05 NOTE — ED Notes (Signed)
Patient states she noticed a whole in the lower left back tooth, but 2 days ago the tooth started hurting.  She states the whole has gotten bigger.

## 2016-05-01 ENCOUNTER — Emergency Department (HOSPITAL_BASED_OUTPATIENT_CLINIC_OR_DEPARTMENT_OTHER)
Admission: EM | Admit: 2016-05-01 | Discharge: 2016-05-01 | Disposition: A | Discharge status: Home / Self Care | Payer: Medicaid Other | Attending: Emergency Medicine | Admitting: Emergency Medicine

## 2016-05-01 ENCOUNTER — Encounter (HOSPITAL_BASED_OUTPATIENT_CLINIC_OR_DEPARTMENT_OTHER): Payer: Self-pay

## 2016-05-01 DIAGNOSIS — N390 Urinary tract infection, site not specified: Secondary | ICD-10-CM | POA: Insufficient documentation

## 2016-05-01 DIAGNOSIS — Z87891 Personal history of nicotine dependence: Secondary | ICD-10-CM | POA: Insufficient documentation

## 2016-05-01 LAB — URINALYSIS, ROUTINE W REFLEX MICROSCOPIC
Bilirubin Urine: NEGATIVE
Glucose, UA: NEGATIVE mg/dL
Ketones, ur: NEGATIVE mg/dL
NITRITE: POSITIVE — AB
PROTEIN: NEGATIVE mg/dL
Specific Gravity, Urine: 1.029 (ref 1.005–1.030)
pH: 6 (ref 5.0–8.0)

## 2016-05-01 LAB — PREGNANCY, URINE: PREG TEST UR: NEGATIVE

## 2016-05-01 LAB — URINALYSIS, MICROSCOPIC (REFLEX)

## 2016-05-01 MED ORDER — CEPHALEXIN 250 MG PO CAPS
500.0000 mg | ORAL_CAPSULE | Freq: Once | ORAL | Status: AC
  Administered 2016-05-01: 500 mg via ORAL
  Filled 2016-05-01: qty 2

## 2016-05-01 MED ORDER — METHOCARBAMOL 500 MG PO TABS
500.0000 mg | ORAL_TABLET | Freq: Two times a day (BID) | ORAL | 0 refills | Status: DC
Start: 2016-05-01 — End: 2016-05-27

## 2016-05-01 MED ORDER — CEPHALEXIN 500 MG PO CAPS: 500.0000 mg | ORAL_CAPSULE | Freq: Four times a day (QID) | ORAL | 0 refills | Status: AC

## 2016-05-01 MED ORDER — METHOCARBAMOL 500 MG PO TABS
500.0000 mg | ORAL_TABLET | Freq: Once | ORAL | Status: AC
  Administered 2016-05-01: 500 mg via ORAL
  Filled 2016-05-01: qty 1

## 2016-05-01 MED FILL — METHOCARBAMOL 500 MG TABLET: 500 | 10 days supply | Qty: 20 | Fill #0

## 2016-05-01 MED FILL — CEPHALEXIN 500 MG CAPSULE: 500 | 10 days supply | Qty: 40 | Fill #0

## 2016-05-01 NOTE — ED Triage Notes (Signed)
C/o left lower back pain started last night-denies inury-reports hx of back spasms-NAD-steady gait

## 2016-05-01 NOTE — ED Provider Notes (Signed)
[facility] Provider Note   CSN: [id number] Arrival date & time: 05/01/16  1124     History   Chief Complaint Chief Complaint  Patient presents with  . Back Pain    HPI Catherine Mosley is a 30 y.o. female.  HPI  30 y.o. female, presents to the Emergency Department today complaining of left lower back pain since yesterday. Hx same. No trauma to area. No lifting injury. Notes pain similar to previous muscle spasms. Has not taken anything for this. No dysuria. No N/V. No chills. No CP/SOB/ABD pain. No fevers. No loss of bowel or bladder function. No saddle anesthesia. No other symptoms noted.   History reviewed. No pertinent past medical history.  There are no active problems to display for this patient.   History reviewed. No pertinent surgical history.  OB History    No data available       Home Medications    Prior to Admission medications   Not on File    Family History No family history on file.  Social History Social History  Substance Use Topics  . Smoking status: Former Smoker    Packs/day: 0.00  . Smokeless tobacco: Never Used  . Alcohol use No     Allergies   Patient has no known allergies.   Review of Systems Review of Systems ROS reviewed and all are negative for acute change except as noted in the HPI.  Physical Exam Updated Vital Signs BP 117/77 (BP Location: Left Arm)   Pulse 71   Temp 98.2 F (36.8 C) (Oral)   Resp 16   Ht 5\' 4"  (1.626 m)   Wt 56.2 kg   LMP 04/06/2016   SpO2 98%   BMI 21.28 kg/m   Physical Exam  Constitutional: She is oriented to person, place, and time. Vital signs are normal. She appears well-developed and well-nourished.  HENT:  Head: Normocephalic.  Right Ear: Hearing normal.  Left Ear: Hearing normal.  Eyes: Conjunctivae and EOM are normal. Pupils are equal, round, and reactive to light.  Neck: Normal range of motion. Neck supple.  Cardiovascular: Normal rate, regular rhythm, normal heart  sounds and intact distal pulses.   Pulmonary/Chest: Effort normal and breath sounds normal.  Abdominal: Soft. There is no tenderness. There is no CVA tenderness.  Musculoskeletal:  TTP Left lower lumbar musculature. NO midline spinous process tenderness.  Neurological: She is alert and oriented to person, place, and time.  Skin: Skin is warm and dry.  Psychiatric: She has a normal mood and affect. Her speech is normal and behavior is normal. Thought content normal.  Nursing note and vitals reviewed.  ED Treatments / Results  Labs (all labs ordered are listed, but only abnormal results are displayed) Labs Reviewed  URINALYSIS, ROUTINE W REFLEX MICROSCOPIC - Abnormal; Notable for the following:       Result Value   APPearance CLOUDY (*)    Hgb urine dipstick MODERATE (*)    Nitrite POSITIVE (*)    Leukocytes, UA SMALL (*)    All other components within normal limits  URINALYSIS, MICROSCOPIC (REFLEX) - Abnormal; Notable for the following:    Bacteria, UA MANY (*)    Squamous Epithelial / LPF 0-5 (*)    All other components within normal limits  PREGNANCY, URINE    EKG  EKG Interpretation None       Radiology No results found.  Procedures Procedures (including critical care time)  Medications Ordered in ED Medications - No data  to display   Initial Impression / Assessment and Plan / ED Course  I have reviewed the triage vital signs and the nursing notes.  Pertinent labs & imaging results that were available during my care of the patient were reviewed by me and considered in my medical decision making (see chart for details).  Clinical Course    Final Clinical Impressions(s) / ED Diagnoses  {I have reviewed and evaluated the relevant laboratory values.   {I have reviewed the relevant previous healthcare records.  {I obtained HPI from historian.   ED Course:  Assessment: Patient is a 30yF who presents to the ED with back pain x 1 day. Left low back. No dysuria. No  fever. No abd pain. No neurological deficits appreciated. Patient is ambulatory. No warning symptoms of back pain including: fecal incontinence, urinary retention or overflow incontinence, night sweats, waking from sleep with back pain, unexplained fevers or weight loss, h/o cancer, IVDU, recent trauma. No concern for cauda equina, epidural abscess, or other serious cause of back pain. UA showed UTI. Low suspicion for Pyelo. Given Rx ABX. Conservative measures such as rest, ice/heat and pain medicine indicated with PCP follow-up if no improvement with conservative management.   Disposition/Plan:  DC Home Additional Verbal discharge instructions given and discussed with patient.  Pt Instructed to f/u with PCP in the next week for evaluation and treatment of symptoms. Return precautions given Pt acknowledges and agrees with plan  Supervising Physician Vanetta MuldersScott Zackowski, MD  Final diagnoses:  Urinary tract infection without hematuria, site unspecified    New Prescriptions New Prescriptions   No medications on file     Audry Piliyler Kayan Blissett, PA-C 05/01/16 1217    Vanetta MuldersScott Zackowski, MD 05/01/16 1524

## 2016-05-01 NOTE — Discharge Instructions (Signed)
Please read and follow all provided instructions.  Your diagnoses today include:  1. Urinary tract infection without hematuria, site unspecified     Tests performed today include: Urine test - suggests that you have an infection in your bladder Vital signs. See below for your results today.   Medications prescribed:  Take as prescribed   Home care instructions:  Follow any educational materials contained in this packet.  Follow-up instructions: Please follow-up with your primary care provider in 3 days if symptoms are not resolved for further evaluation of your symptoms.  Return instructions:  Please return to the Emergency Department if you experience worsening symptoms.  Return with fever, worsening pain, persistent vomiting, worsening pain in your back.  Please return if you have any other emergent concerns.  Additional Information:  Your vital signs today were: BP 117/77 (BP Location: Left Arm)    Pulse 71    Temp 98.2 F (36.8 C) (Oral)    Resp 16    Ht 5\' 4"  (1.626 m)    Wt 56.2 kg    LMP 04/06/2016    SpO2 98%    BMI 21.28 kg/m  If your blood pressure (BP) was elevated above 135/85 this visit, please have this repeated by your doctor within one month. --------------

## 2016-05-27 ENCOUNTER — Emergency Department (HOSPITAL_BASED_OUTPATIENT_CLINIC_OR_DEPARTMENT_OTHER)
Admission: EM | Admit: 2016-05-27 | Discharge: 2016-05-28 | Disposition: A | Discharge status: Home / Self Care | Payer: Medicaid Other | Attending: Emergency Medicine | Admitting: Emergency Medicine

## 2016-05-27 ENCOUNTER — Encounter (HOSPITAL_BASED_OUTPATIENT_CLINIC_OR_DEPARTMENT_OTHER): Payer: Self-pay | Admitting: *Deleted

## 2016-05-27 DIAGNOSIS — Z87891 Personal history of nicotine dependence: Secondary | ICD-10-CM | POA: Insufficient documentation

## 2016-05-27 DIAGNOSIS — R112 Nausea with vomiting, unspecified: Secondary | ICD-10-CM | POA: Insufficient documentation

## 2016-05-27 DIAGNOSIS — F1290 Cannabis use, unspecified, uncomplicated: Secondary | ICD-10-CM | POA: Insufficient documentation

## 2016-05-27 DIAGNOSIS — R1030 Lower abdominal pain, unspecified: Secondary | ICD-10-CM | POA: Insufficient documentation

## 2016-05-27 LAB — COMPREHENSIVE METABOLIC PANEL
ALT: 13 U/L — ABNORMAL LOW (ref 14–54)
ANION GAP: 7 (ref 5–15)
AST: 18 U/L (ref 15–41)
Albumin: 4.1 g/dL (ref 3.5–5.0)
Alkaline Phosphatase: 46 U/L (ref 38–126)
BUN: 12 mg/dL (ref 6–20)
CO2: 23 mmol/L (ref 22–32)
Calcium: 8.8 mg/dL — ABNORMAL LOW (ref 8.9–10.3)
Chloride: 105 mmol/L (ref 101–111)
Creatinine, Ser: 0.58 mg/dL (ref 0.44–1.00)
Glucose, Bld: 104 mg/dL — ABNORMAL HIGH (ref 65–99)
Potassium: 3.7 mmol/L (ref 3.5–5.1)
SODIUM: 135 mmol/L (ref 135–145)
TOTAL PROTEIN: 7.2 g/dL (ref 6.5–8.1)
Total Bilirubin: 0.7 mg/dL (ref 0.3–1.2)

## 2016-05-27 LAB — CBC
HCT: 36.2 % (ref 36.0–46.0)
HEMOGLOBIN: 12.3 g/dL (ref 12.0–15.0)
MCH: 33.6 pg (ref 26.0–34.0)
MCHC: 34 g/dL (ref 30.0–36.0)
MCV: 98.9 fL (ref 78.0–100.0)
PLATELETS: 165 10*3/uL (ref 150–400)
RBC: 3.66 MIL/uL — AB (ref 3.87–5.11)
RDW: 12.3 % (ref 11.5–15.5)
WBC: 8.6 10*3/uL (ref 4.0–10.5)

## 2016-05-27 LAB — DIFFERENTIAL
BASOS ABS: 0 10*3/uL (ref 0.0–0.1)
Basophils Relative: 0 %
EOS PCT: 1 %
Eosinophils Absolute: 0.1 10*3/uL (ref 0.0–0.7)
LYMPHS ABS: 0.9 10*3/uL (ref 0.7–4.0)
LYMPHS PCT: 11 %
Monocytes Absolute: 0.5 10*3/uL (ref 0.1–1.0)
Monocytes Relative: 6 %
NEUTROS ABS: 7.1 10*3/uL (ref 1.7–7.7)
Neutrophils Relative %: 82 %

## 2016-05-27 LAB — URINALYSIS, ROUTINE W REFLEX MICROSCOPIC
Bilirubin Urine: NEGATIVE
GLUCOSE, UA: NEGATIVE mg/dL
HGB URINE DIPSTICK: NEGATIVE
Ketones, ur: NEGATIVE mg/dL
Nitrite: NEGATIVE
Protein, ur: NEGATIVE mg/dL
SPECIFIC GRAVITY, URINE: 1.022 (ref 1.005–1.030)
pH: 7 (ref 5.0–8.0)

## 2016-05-27 LAB — URINALYSIS, MICROSCOPIC (REFLEX)

## 2016-05-27 LAB — LIPASE, BLOOD: LIPASE: 13 U/L (ref 11–51)

## 2016-05-27 LAB — PREGNANCY, URINE: PREG TEST UR: NEGATIVE

## 2016-05-27 MED ORDER — ACETAMINOPHEN 325 MG PO TABS
650.0000 mg | ORAL_TABLET | Freq: Once | ORAL | Status: AC
  Administered 2016-05-28: 650 mg via ORAL
  Filled 2016-05-27: qty 2

## 2016-05-27 NOTE — ED Provider Notes (Signed)
[facility] Provider Note: Lowella DellJ. Lane Jarone Ostergaard, MD, FACEP  CSN: [id number] MRN: [id number] ARRIVAL: 05/27/16 at 2028 ROOM: MH02/MH02   By signing my name below, I, Levon HedgerElizabeth Hall, attest that this documentation has been prepared under the direction and in the presence of Paula LibraJohn Charlesia Canaday, MD . Electronically Signed: Levon HedgerElizabeth Hall, Scribe. 05/27/2016. 11:29 PM.   CHIEF COMPLAINT  Abdominal Pain   HISTORY OF PRESENT ILLNESS  Tomma RakersBrittany Blakley is a 31 y.o. female who presents to the Emergency Department complaining of gradually improving, cramping suprapubic abdominal pain that began at 12 noon. She notes associated nausea which has improved, vomiting, and headache. No alleviating or modifying factors noted. Pt denies any fever or diarrhea.   History reviewed. No pertinent past medical history.  History reviewed. No pertinent surgical history.  History reviewed. No pertinent family history.  Social History  Substance Use Topics  . Smoking status: Former Smoker    Packs/day: 0.00  . Smokeless tobacco: Never Used  . Alcohol use No    Prior to Admission medications   Not on File    Allergies Patient has no known allergies.   REVIEW OF SYSTEMS  Negative except as noted here or in the History of Present Illness.  PHYSICAL EXAMINATION  Initial Vital Signs Blood pressure 99/64, pulse 64, temperature 98.4 F (36.9 C), temperature source Oral, resp. rate 18, height 5\' 4"  (1.626 m), weight 125 lb (56.7 kg), last menstrual period 05/06/2016, SpO2 100 %.  Examination General: Well-developed, well-nourished female in no acute distress; appearance consistent with age of record HENT: normocephalic; atraumatic Eyes: pupils equal, round and reactive to light; extraocular muscles intact Neck: supple Heart: regular rate and rhythm Lungs: clear to auscultation bilaterally Abdomen: soft; nondistended; mild lower abdominal tenderness; no masses or hepatosplenomegaly; bowel sounds  present Extremities: No deformity; full range of motion; pulses normal Neurologic: Awake, alert and oriented; motor function intact in all extremities and symmetric; no facial droop Skin: Warm and dry Psychiatric: Normal mood and affect   RESULTS  Summary of this visit's results, reviewed by myself:   EKG Interpretation  Date/Time:    Ventricular Rate:    PR Interval:    QRS Duration:   QT Interval:    QTC Calculation:   R Axis:     Text Interpretation:        Laboratory Studies: Results for orders placed or performed during the hospital encounter of 05/27/16 (from the past 24 hour(s))  Urinalysis, Routine w reflex microscopic     Status: Abnormal   Collection Time: 05/27/16  8:41 PM  Result Value Ref Range   Color, Urine YELLOW YELLOW   APPearance CLOUDY (A) CLEAR   Specific Gravity, Urine 1.022 1.005 - 1.030   pH 7.0 5.0 - 8.0   Glucose, UA NEGATIVE NEGATIVE mg/dL   Hgb urine dipstick NEGATIVE NEGATIVE   Bilirubin Urine NEGATIVE NEGATIVE   Ketones, ur NEGATIVE NEGATIVE mg/dL   Protein, ur NEGATIVE NEGATIVE mg/dL   Nitrite NEGATIVE NEGATIVE   Leukocytes, UA SMALL (A) NEGATIVE  Pregnancy, urine     Status: None   Collection Time: 05/27/16  8:41 PM  Result Value Ref Range   Preg Test, Ur NEGATIVE NEGATIVE  Urinalysis, Microscopic (reflex)     Status: Abnormal   Collection Time: 05/27/16  8:41 PM  Result Value Ref Range   RBC / HPF 0-5 0 - 5 RBC/hpf   WBC, UA 6-30 0 - 5 WBC/hpf   Bacteria, UA FEW (A) NONE SEEN  Squamous Epithelial / LPF 6-30 (A) NONE SEEN   Mucous PRESENT   Lipase, blood     Status: None   Collection Time: 05/27/16 10:40 PM  Result Value Ref Range   Lipase 13 11 - 51 U/L  Comprehensive metabolic panel     Status: Abnormal   Collection Time: 05/27/16 10:40 PM  Result Value Ref Range   Sodium 135 135 - 145 mmol/L   Potassium 3.7 3.5 - 5.1 mmol/L   Chloride 105 101 - 111 mmol/L   CO2 23 22 - 32 mmol/L   Glucose, Bld 104 (H) 65 - 99 mg/dL    BUN 12 6 - 20 mg/dL   Creatinine, Ser 1.61 0.44 - 1.00 mg/dL   Calcium 8.8 (L) 8.9 - 10.3 mg/dL   Total Protein 7.2 6.5 - 8.1 g/dL   Albumin 4.1 3.5 - 5.0 g/dL   AST 18 15 - 41 U/L   ALT 13 (L) 14 - 54 U/L   Alkaline Phosphatase 46 38 - 126 U/L   Total Bilirubin 0.7 0.3 - 1.2 mg/dL   GFR calc non Af Amer >60 >60 mL/min   GFR calc Af Amer >60 >60 mL/min   Anion gap 7 5 - 15  CBC     Status: Abnormal   Collection Time: 05/27/16 10:40 PM  Result Value Ref Range   WBC 8.6 4.0 - 10.5 K/uL   RBC 3.66 (L) 3.87 - 5.11 MIL/uL   Hemoglobin 12.3 12.0 - 15.0 g/dL   HCT 09.6 04.5 - 40.9 %   MCV 98.9 78.0 - 100.0 fL   MCH 33.6 26.0 - 34.0 pg   MCHC 34.0 30.0 - 36.0 g/dL   RDW 81.1 91.4 - 78.2 %   Platelets 165 150 - 400 K/uL  Differential     Status: None   Collection Time: 05/27/16 10:40 PM  Result Value Ref Range   Neutrophils Relative % 82 %   Neutro Abs 7.1 1.7 - 7.7 K/uL   Lymphocytes Relative 11 %   Lymphs Abs 0.9 0.7 - 4.0 K/uL   Monocytes Relative 6 %   Monocytes Absolute 0.5 0.1 - 1.0 K/uL   Eosinophils Relative 1 %   Eosinophils Absolute 0.1 0.0 - 0.7 K/uL   Basophils Relative 0 %   Basophils Absolute 0.0 0.0 - 0.1 K/uL   Imaging Studies: No results found.  ED COURSE  Nursing notes and initial vitals signs, including pulse oximetry, reviewed.  Vitals:   05/27/16 2037 05/27/16 2038 05/27/16 2244 05/28/16 0051  BP:  97/74 99/64 94/55   Pulse:  91 64 73  Resp:  16 18 18   Temp:  98.4 F (36.9 C)    TempSrc:  Oral    SpO2:  100% 100% 99%  Weight: 125 lb (56.7 kg)     Height: 5\' 4"  (1.626 m)      1:26 AM Patient symptoms continue to improve. This likely represents a viral GI illness but we'll treat for possible early UTI.  PROCEDURES    ED DIAGNOSES     ICD-9-CM ICD-10-CM   1. Nausea and vomiting in adult 787.01 R11.2     I personally performed the services described in this documentation, which was scribed in my presence. The recorded information has been  reviewed and is accurate.    Paula Libra, MD 05/28/16 9562

## 2016-05-27 NOTE — ED Triage Notes (Signed)
Pt reports N/V that started today.  Reports sharp abdominal pain.  No acute distress noted. Ambulatory.

## 2016-05-28 MED ORDER — ONDANSETRON 8 MG PO TBDP: 8.0000 mg | ORAL_TABLET | Freq: Three times a day (TID) | ORAL | 0 refills | Status: DC | PRN

## 2016-05-28 MED ORDER — CEPHALEXIN 500 MG PO CAPS: 500.0000 mg | ORAL_CAPSULE | Freq: Two times a day (BID) | ORAL | 0 refills | Status: AC

## 2016-05-29 LAB — URINE CULTURE

## 2016-07-29 ENCOUNTER — Emergency Department (HOSPITAL_BASED_OUTPATIENT_CLINIC_OR_DEPARTMENT_OTHER)
Admission: EM | Admit: 2016-07-29 | Discharge: 2016-07-29 | Disposition: A | Discharge status: Home / Self Care | Payer: Medicaid Other | Attending: Emergency Medicine | Admitting: Emergency Medicine

## 2016-07-29 ENCOUNTER — Encounter (HOSPITAL_BASED_OUTPATIENT_CLINIC_OR_DEPARTMENT_OTHER): Payer: Self-pay

## 2016-07-29 DIAGNOSIS — Z87891 Personal history of nicotine dependence: Secondary | ICD-10-CM | POA: Insufficient documentation

## 2016-07-29 DIAGNOSIS — K0889 Other specified disorders of teeth and supporting structures: Secondary | ICD-10-CM

## 2016-07-29 DIAGNOSIS — K029 Dental caries, unspecified: Secondary | ICD-10-CM | POA: Insufficient documentation

## 2016-07-29 MED ORDER — IBUPROFEN 800 MG PO TABS
800.0000 mg | ORAL_TABLET | Freq: Once | ORAL | Status: AC
  Administered 2016-07-29: 800 mg via ORAL
  Filled 2016-07-29: qty 1

## 2016-07-29 MED ORDER — IBUPROFEN 600 MG PO TABS: 600.0000 mg | ORAL_TABLET | Freq: Four times a day (QID) | ORAL | 0 refills | Status: DC | PRN

## 2016-07-29 MED ORDER — ACETAMINOPHEN 325 MG PO TABS: 650.0000 mg | ORAL_TABLET | Freq: Four times a day (QID) | ORAL | 0 refills | Status: DC | PRN

## 2016-07-29 NOTE — Discharge Instructions (Signed)
Please read and follow all provided instructions.  Your diagnoses today include:  1. Pain, dental     Tests performed today include: Vital signs. See below for your results today.   Medications prescribed:  Take as prescribed   Home care instructions:  Follow any educational materials contained in this packet.  Follow-up instructions: Please follow-up with your Dentist for further evaluation of symptoms and treatment   Please Contact this number: [phone] to get into contact with an emergency dental service to schedule an appointment with a local dentist    Return instructions:  Please return to the Emergency Department if you do not get better, if you get worse, or new symptoms OR  - Fever (temperature greater than 101.74F)  - Bleeding that does not stop with holding pressure to the area    -Severe pain (please note that you may be more sore the day after your accident)  - Chest Pain  - Difficulty breathing  - Severe nausea or vomiting  - Inability to tolerate food and liquids  - Passing out  - Skin becoming red around your wounds  - Change in mental status (confusion or lethargy)  - New numbness or weakness    Please return if you have any other emergent concerns.  Additional Information:  Your vital signs today were: BP 110/75 (BP Location: Left Arm)    Pulse 67    Temp 98.4 F (36.9 C) (Oral)    Resp 16    Ht 5\' 4"  (1.626 m)    Wt 59.9 kg    LMP 07/02/2016    SpO2 100%    BMI 22.66 kg/m  If your blood pressure (BP) was elevated above 135/85 this visit, please have this repeated by your doctor within one month. --------------

## 2016-07-29 NOTE — ED Triage Notes (Signed)
Left lower toothache-started yesterday-NAD-steady gait

## 2016-07-29 NOTE — ED Provider Notes (Signed)
[facility]   CSN: [id number] Arrival date & time: 07/29/16  1223     History   Chief Complaint Chief Complaint  Patient presents with  . Dental Pain    HPI Catherine Mosley is a 31 y.o. female.  HPI  31 y.o. female with no significant medical history, presents to the Emergency Department today complaining of "left-sided tooth pain". The patient reports that her tooth started hurting yesterday and notes the pain has been increasing in severity since this time. The pain work her up from sleep this morning at 5:00am prompting her to present to the ED. She describes a change of appearance in her bottom left molar stating "it looks like there's a hole in the tooth". The pain is sharp in nature and the patient feels it is now causing her head and eyes to hurt. She has taken Tylenol without relief. The patient endorses tooth pain, headache, and eye pain. She denies fever, chills, night sweats, recent illness, CP, SOB, abdominal pain, and N/V. She hasn't eaten anything today due to the pain. The patient reports a history of dental abscesses in the past but does not recall when she last saw a dentist.    History reviewed. No pertinent past medical history.  There are no active problems to display for this patient.   History reviewed. No pertinent surgical history.  OB History    No data available       Home Medications    Prior to Admission medications   Not on File    Family History No family history on file.  Social History Social History  Substance Use Topics  . Smoking status: Former Games developer  . Smokeless tobacco: Never Used  . Alcohol use No     Allergies   Patient has no known allergies.   Review of Systems Review of Systems ROS reviewed and all are negative for acute change except as noted in the HPI.  Physical Exam Updated Vital Signs BP 110/75 (BP Location: Left Arm)   Pulse 67   Temp 98.4 F (36.9 C) (Oral)   Resp 16   Ht 5'  4" (1.626 m)   Wt 59.9 kg   LMP 07/02/2016   SpO2 100%   BMI 22.66 kg/m   Physical Exam  Constitutional: She is oriented to person, place, and time. Vital signs are normal. She appears well-developed and well-nourished. No distress.  HENT:  Head: Normocephalic and atraumatic.  Right Ear: Hearing, tympanic membrane, external ear and ear canal normal.  Left Ear: Hearing, tympanic membrane, external ear and ear canal normal.  Nose: Nose normal.  Mouth/Throat: Uvula is midline, oropharynx is clear and moist and mucous membranes are normal. No trismus in the jaw. No oropharyngeal exudate, posterior oropharyngeal erythema or tonsillar abscesses.  Dental cary noted left back molar. No abscess. No trismus. No PTA.  Eyes: Conjunctivae and EOM are normal. Pupils are equal, round, and reactive to light.  Neck: Normal range of motion. Neck supple. No tracheal deviation present.  Cardiovascular: Normal rate, regular rhythm, S1 normal, S2 normal, normal heart sounds, intact distal pulses and normal pulses.   Pulmonary/Chest: Effort normal and breath sounds normal. No respiratory distress. She has no decreased breath sounds. She has no wheezes. She has no rhonchi. She has no rales.  Abdominal: Normal appearance and bowel sounds are normal. There is no tenderness.  Musculoskeletal: Normal range of motion.  Neurological: She is alert and oriented to person, place, and time.  Skin: Skin is warm and dry.  Psychiatric: She has a normal mood and affect. Her speech is normal and behavior is normal. Thought content normal.   ED Treatments / Results  Labs (all labs ordered are listed, but only abnormal results are displayed) Labs Reviewed - No data to display  EKG  EKG Interpretation None       Radiology No results found.  Procedures Procedures (including critical care time)  Medications Ordered in ED Medications - No data to display   Initial Impression / Assessment and Plan / ED Course  I  have reviewed the triage vital signs and the nursing notes.  Pertinent labs & imaging results that were available during my care of the patient were reviewed by me and considered in my medical decision making (see chart for details).  Final Clinical Impressions(s) / ED Diagnoses     {I have reviewed the relevant previous healthcare records.  {I obtained HPI from historian.   ED Course:  Assessment: Dental pain associated with dental cary but no signs or symptoms of dental abscess with patient afebrile, non toxic appearing and swallowing secretions well. Exam unconcerning for Ludwig's angina or other deep tissue infection in neck. As there is no facial swelling or gum findings, will not prescribe antibiotics at this time. Will treat with pain medication.  I gave patient referral to dentist and stressed the importance of dental follow up for ultimate management of dental pain. Patient voices understanding and is agreeable to plan.  Disposition/Plan:  Dc Home Additional Verbal discharge instructions given and discussed with patient.  Pt Instructed to f/u with Dentist in the next week for evaluation and treatment of symptoms. Return precautions given Pt acknowledges and agrees with plan  Supervising Physician Vanetta MuldersScott Zackowski, MD  Final diagnoses:  Pain, dental    New Prescriptions New Prescriptions   No medications on file     Audry Piliyler Cythia Bachtel, PA-C 07/29/16 1352    Vanetta MuldersScott Zackowski, MD 07/31/16 [phone]

## 2016-08-30 ENCOUNTER — Emergency Department (HOSPITAL_BASED_OUTPATIENT_CLINIC_OR_DEPARTMENT_OTHER): Payer: Self-pay

## 2016-08-30 ENCOUNTER — Encounter (HOSPITAL_BASED_OUTPATIENT_CLINIC_OR_DEPARTMENT_OTHER): Payer: Self-pay | Admitting: Radiology

## 2016-08-30 ENCOUNTER — Emergency Department (HOSPITAL_BASED_OUTPATIENT_CLINIC_OR_DEPARTMENT_OTHER)
Admission: EM | Admit: 2016-08-30 | Discharge: 2016-08-30 | Disposition: A | Discharge status: Home / Self Care | Payer: Self-pay | Attending: Emergency Medicine | Admitting: Emergency Medicine

## 2016-08-30 DIAGNOSIS — Z87891 Personal history of nicotine dependence: Secondary | ICD-10-CM | POA: Insufficient documentation

## 2016-08-30 DIAGNOSIS — R52 Pain, unspecified: Secondary | ICD-10-CM

## 2016-08-30 DIAGNOSIS — N83202 Unspecified ovarian cyst, left side: Secondary | ICD-10-CM | POA: Insufficient documentation

## 2016-08-30 LAB — CBC WITH DIFFERENTIAL/PLATELET
BASOS ABS: 0 10*3/uL (ref 0.0–0.1)
BASOS PCT: 0 %
Eosinophils Absolute: 0.1 10*3/uL (ref 0.0–0.7)
Eosinophils Relative: 2 %
HEMATOCRIT: 33 % — AB (ref 36.0–46.0)
Hemoglobin: 11.2 g/dL — ABNORMAL LOW (ref 12.0–15.0)
LYMPHS ABS: 2.9 10*3/uL (ref 0.7–4.0)
LYMPHS PCT: 48 %
MCH: 33.5 pg (ref 26.0–34.0)
MCHC: 33.9 g/dL (ref 30.0–36.0)
MCV: 98.8 fL (ref 78.0–100.0)
MONOS PCT: 8 %
Monocytes Absolute: 0.5 10*3/uL (ref 0.1–1.0)
Neutro Abs: 2.5 10*3/uL (ref 1.7–7.7)
Neutrophils Relative %: 42 %
Platelets: 167 10*3/uL (ref 150–400)
RBC: 3.34 MIL/uL — AB (ref 3.87–5.11)
RDW: 12.1 % (ref 11.5–15.5)
WBC: 5.9 10*3/uL (ref 4.0–10.5)

## 2016-08-30 LAB — BASIC METABOLIC PANEL
ANION GAP: 6 (ref 5–15)
BUN: 14 mg/dL (ref 6–20)
CO2: 25 mmol/L (ref 22–32)
Calcium: 8.7 mg/dL — ABNORMAL LOW (ref 8.9–10.3)
Chloride: 109 mmol/L (ref 101–111)
Creatinine, Ser: 0.72 mg/dL (ref 0.44–1.00)
GFR calc Af Amer: >60 mL/min (ref >60)
GLUCOSE: 103 mg/dL — AB (ref 65–99)
POTASSIUM: 3.6 mmol/L (ref 3.5–5.1)
Sodium: 140 mmol/L (ref 135–145)

## 2016-08-30 LAB — URINALYSIS, ROUTINE W REFLEX MICROSCOPIC
Bilirubin Urine: NEGATIVE
GLUCOSE, UA: NEGATIVE mg/dL
Ketones, ur: NEGATIVE mg/dL
Nitrite: NEGATIVE
PH: 5.5 (ref 5.0–8.0)
PROTEIN: NEGATIVE mg/dL
Specific Gravity, Urine: 1.028 (ref 1.005–1.030)

## 2016-08-30 LAB — URINALYSIS, MICROSCOPIC (REFLEX)

## 2016-08-30 LAB — PREGNANCY, URINE: PREG TEST UR: NEGATIVE

## 2016-08-30 MED ORDER — ONDANSETRON HCL 4 MG/2ML IJ SOLN
4.0000 mg | Freq: Once | INTRAMUSCULAR | Status: AC
  Administered 2016-08-30: 4 mg via INTRAVENOUS
  Filled 2016-08-30: qty 2

## 2016-08-30 MED ORDER — KETOROLAC TROMETHAMINE 30 MG/ML IJ SOLN
30.0000 mg | Freq: Once | INTRAMUSCULAR | Status: AC
  Administered 2016-08-30: 30 mg via INTRAVENOUS
  Filled 2016-08-30: qty 1

## 2016-08-30 MED ORDER — SODIUM CHLORIDE 0.9 % IV BOLUS (SEPSIS)
1000.0000 mL | Freq: Once | INTRAVENOUS | Status: AC
  Administered 2016-08-30: 1000 mL via INTRAVENOUS

## 2016-08-30 MED ORDER — IOPAMIDOL (ISOVUE-300) INJECTION 61%
100.0000 mL | Freq: Once | INTRAVENOUS | Status: AC | PRN
  Administered 2016-08-30: 100 mL via INTRAVENOUS

## 2016-08-30 NOTE — ED Triage Notes (Addendum)
Pt c/o lower back pain and lower abd pain. Pt states she has been having worsening pain at beginning of period since having a tubal pregnancy in Oct 2016.

## 2016-08-30 NOTE — ED Notes (Signed)
Patient transported to CT and returned at this time 

## 2016-08-30 NOTE — ED Provider Notes (Signed)
[facility]   CSN: [id number] Arrival date & time: 08/30/16  0515     History   Chief Complaint Chief Complaint  Patient presents with  . Abdominal Pain    HPI Karalynn Cottone is a 31 y.o. female.  Patient is a 31 year old female with history of tubal pregnancy approximately 6 months ago. She presents today for evaluation of lower abdominal and lower back pain. This is been occurring intermittently since her period, however suddenly worsened tonight. She denies any vomiting or diarrhea. She denies any vaginal discharge.   The history is provided by the patient.  Abdominal Pain   This is a new problem. The current episode started yesterday. The problem occurs constantly. The problem has been rapidly worsening. The pain is located in the RLQ. The quality of the pain is cramping. The pain is severe. Pertinent negatives include fever, hematochezia, melena, constipation and dysuria. The symptoms are aggravated by palpation. Nothing relieves the symptoms.    No past medical history on file.  There are no active problems to display for this patient.   No past surgical history on file.  OB History    No data available       Home Medications    Prior to Admission medications   Medication Sig Start Date End Date Taking? Authorizing Provider  acetaminophen (TYLENOL) 325 MG tablet Take 2 tablets (650 mg total) by mouth every 6 (six) hours as needed. 07/29/16   Audry Pili, PA-C  ibuprofen (ADVIL,MOTRIN) 600 MG tablet Take 1 tablet (600 mg total) by mouth every 6 (six) hours as needed. 07/29/16   Audry Pili, PA-C    Family History No family history on file.  Social History Social History  Substance Use Topics  . Smoking status: Former Games developer  . Smokeless tobacco: Never Used  . Alcohol use No     Allergies   Patient has no known allergies.   Review of Systems Review of Systems  Constitutional: Negative for fever.  Gastrointestinal: Positive for  abdominal pain. Negative for constipation, hematochezia and melena.  Genitourinary: Negative for dysuria.  All other systems reviewed and are negative.    Physical Exam Updated Vital Signs BP 109/79 (BP Location: Left Arm)   Pulse (!) 58   Temp 98.8 F (37.1 C) (Oral)   Resp 18   SpO2 100%   Physical Exam  Constitutional: She is oriented to person, place, and time. She appears well-developed and well-nourished. No distress.  Patient is a 31 year old female who appears uncomfortable.  HENT:  Head: Normocephalic and atraumatic.  Neck: Normal range of motion. Neck supple.  Cardiovascular: Normal rate and regular rhythm.  Exam reveals no gallop and no friction rub.   No murmur heard. Pulmonary/Chest: Effort normal and breath sounds normal. No respiratory distress. She has no wheezes.  Abdominal: Soft. Bowel sounds are normal. She exhibits no distension. There is tenderness. There is no rebound and no guarding.  There is tenderness to palpation in the right lower quadrant and suprapubic region.  Musculoskeletal: Normal range of motion.  Neurological: She is alert and oriented to person, place, and time.  Skin: Skin is warm and dry. She is not diaphoretic.  Nursing note and vitals reviewed.    ED Treatments / Results  Labs (all labs ordered are listed, but only abnormal results are displayed) Labs Reviewed  PREGNANCY, URINE  URINALYSIS, ROUTINE W REFLEX MICROSCOPIC  BASIC METABOLIC PANEL  CBC WITH DIFFERENTIAL/PLATELET    EKG  EKG Interpretation  None       Radiology No results found.  Procedures Procedures (including critical care time)  Medications Ordered in ED Medications  sodium chloride 0.9 % bolus 1,000 mL (not administered)  ketorolac (TORADOL) 30 MG/ML injection 30 mg (not administered)  ondansetron (ZOFRAN) injection 4 mg (not administered)     Initial Impression / Assessment and Plan / ED Course  I have reviewed the triage vital signs and the  nursing notes.  Pertinent labs & imaging results that were available during my care of the patient were reviewed by me and considered in my medical decision making (see chart for details).  Patient with right lower quadrant pain. She has no white count and laboratory studies are otherwise unremarkable. Urinalysis is not suggestive of infection. She will undergo a CT of the abdomen and pelvis to rule out appendicitis. Care will be signed out to Dr. Corlis Leak at shift change. She will obtain the results of the CT scan and determine the final disposition.  Final Clinical Impressions(s) / ED Diagnoses   Final diagnoses:  None    New Prescriptions New Prescriptions   No medications on file     Geoffery Lyons, MD 09/01/16 [phone]

## 2016-08-30 NOTE — ED Notes (Signed)
Pt ambulatory to and from BR without assistance, in NAD. 

## 2016-08-30 NOTE — ED Notes (Signed)
Patient transported to Ultrasound 

## 2016-08-30 NOTE — Discharge Instructions (Signed)
You have found to have an ovarian cyst on the left-hand side. This is normal. There is nothing to be concerned about. Please follow-up with her OB/GYN. It could be a source of your pain. Please return with any fever, nausea vomiting or other concerns.

## 2016-08-30 NOTE — ED Notes (Signed)
Work note given

## 2016-09-18 ENCOUNTER — Emergency Department (HOSPITAL_BASED_OUTPATIENT_CLINIC_OR_DEPARTMENT_OTHER)
Admission: EM | Admit: 2016-09-18 | Discharge: 2016-09-18 | Disposition: A | Discharge status: Home / Self Care | Payer: Medicaid Other | Attending: Emergency Medicine | Admitting: Emergency Medicine

## 2016-09-18 ENCOUNTER — Encounter (HOSPITAL_BASED_OUTPATIENT_CLINIC_OR_DEPARTMENT_OTHER): Payer: Self-pay | Admitting: Emergency Medicine

## 2016-09-18 DIAGNOSIS — N3000 Acute cystitis without hematuria: Secondary | ICD-10-CM | POA: Insufficient documentation

## 2016-09-18 DIAGNOSIS — Z87891 Personal history of nicotine dependence: Secondary | ICD-10-CM | POA: Insufficient documentation

## 2016-09-18 LAB — PREGNANCY, URINE: Preg Test, Ur: NEGATIVE

## 2016-09-18 LAB — CBC WITH DIFFERENTIAL/PLATELET
Basophils Absolute: 0 10*3/uL (ref 0.0–0.1)
Basophils Relative: 0 %
EOS PCT: 0 %
Eosinophils Absolute: 0 10*3/uL (ref 0.0–0.7)
HCT: 34 % — ABNORMAL LOW (ref 36.0–46.0)
Hemoglobin: 11.4 g/dL — ABNORMAL LOW (ref 12.0–15.0)
LYMPHS PCT: 13 %
Lymphs Abs: 1.2 10*3/uL (ref 0.7–4.0)
MCH: 34.1 pg — AB (ref 26.0–34.0)
MCHC: 33.5 g/dL (ref 30.0–36.0)
MCV: 101.8 fL — AB (ref 78.0–100.0)
MONO ABS: 1.1 10*3/uL — AB (ref 0.1–1.0)
MONOS PCT: 12 %
Neutro Abs: 7.1 10*3/uL (ref 1.7–7.7)
Neutrophils Relative %: 75 %
PLATELETS: 153 10*3/uL (ref 150–400)
RBC: 3.34 MIL/uL — AB (ref 3.87–5.11)
RDW: 12.4 % (ref 11.5–15.5)
WBC: 9.5 10*3/uL (ref 4.0–10.5)

## 2016-09-18 LAB — COMPREHENSIVE METABOLIC PANEL
ALBUMIN: 3.6 g/dL (ref 3.5–5.0)
ALT: 10 U/L — ABNORMAL LOW (ref 14–54)
AST: 16 U/L (ref 15–41)
Alkaline Phosphatase: 44 U/L (ref 38–126)
Anion gap: 8 (ref 5–15)
BUN: 9 mg/dL (ref 6–20)
CHLORIDE: 103 mmol/L (ref 101–111)
CO2: 27 mmol/L (ref 22–32)
Calcium: 8.8 mg/dL — ABNORMAL LOW (ref 8.9–10.3)
Creatinine, Ser: 0.82 mg/dL (ref 0.44–1.00)
GFR calc Af Amer: >60 mL/min (ref >60)
GFR calc non Af Amer: >60 mL/min (ref >60)
GLUCOSE: 88 mg/dL (ref 65–99)
POTASSIUM: 3.6 mmol/L (ref 3.5–5.1)
Sodium: 138 mmol/L (ref 135–145)
Total Bilirubin: 1 mg/dL (ref 0.3–1.2)
Total Protein: 6.8 g/dL (ref 6.5–8.1)

## 2016-09-18 LAB — URINALYSIS, ROUTINE W REFLEX MICROSCOPIC
BILIRUBIN URINE: NEGATIVE
GLUCOSE, UA: NEGATIVE mg/dL
Ketones, ur: NEGATIVE mg/dL
Nitrite: POSITIVE — AB
PROTEIN: 100 mg/dL — AB
Specific Gravity, Urine: 1.012 (ref 1.005–1.030)
pH: 6 (ref 5.0–8.0)

## 2016-09-18 LAB — URINALYSIS, MICROSCOPIC (REFLEX)

## 2016-09-18 MED ORDER — CEPHALEXIN 500 MG PO CAPS: 500.0000 mg | ORAL_CAPSULE | Freq: Two times a day (BID) | ORAL | 0 refills | Status: AC

## 2016-09-18 MED FILL — CEPHALEXIN 500 MG CAPSULE: 500 | 7 days supply | Qty: 14 | Fill #0

## 2016-09-18 NOTE — ED Triage Notes (Signed)
Bilateral lower back pain, urinary urgency

## 2016-09-18 NOTE — ED Provider Notes (Signed)
Emergency Department Provider Note   I have reviewed the triage vital signs and the nursing notes.   HISTORY  Chief Complaint Back Pain   HPI Catherine Mosley is a 31 y.o. female with no significant PMH presents to the emergency department for evaluation of urinary frequency and bilateral lower back pain. Patient states his symptoms began yesterday and it gradually worsening throughout the evening and morning. She denies any unilateral back discomfort. She states it feels like a cramping is moderately painful. She has not tried any over-the-counter pain medications. Has not used a heating pad. Denies any dysuria. No vaginal bleeding or discharge. Denies any abdominal pain. She states she had some nausea last night because of severe discomfort in her back. No numbness or weakness in the legs. No difficulty with ambulation.   History reviewed. No pertinent past medical history.  There are no active problems to display for this patient.   History reviewed. No pertinent surgical history.  Current Outpatient Rx  . Order #: [id number] Class: Print  . Order #: [id number] Class: Print  . Order #: [id number] Class: Print    Allergies Patient has no known allergies.  History reviewed. No pertinent family history.  Social History Social History  Substance Use Topics  . Smoking status: Former Games developer  . Smokeless tobacco: Never Used  . Alcohol use No    Review of Systems  Constitutional: No fever/chills Eyes: No visual changes. ENT: No sore throat. Cardiovascular: Denies chest pain. Respiratory: Denies shortness of breath. Gastrointestinal: No abdominal pain.  No nausea, no vomiting.  No diarrhea.  No constipation. Genitourinary: Negative for dysuria. Positive hesitancy and urgency.  Musculoskeletal: Positive for back pain. Skin: Negative for rash. Neurological: Negative for headaches, focal weakness or numbness.  10-point ROS otherwise  negative.  ____________________________________________   PHYSICAL EXAM:  VITAL SIGNS: ED Triage Vitals  Enc Vitals Group     BP 09/18/16 1051 111/67     Pulse Rate 09/18/16 1051 71     Resp 09/18/16 1051 18     Temp 09/18/16 1051 99.5 F (37.5 C)     Temp Source 09/18/16 1051 Oral     SpO2 09/18/16 1051 100 %     Weight 09/18/16 1051 130 lb (59 kg)     Height 09/18/16 1051  (1.626 m)     Pain Score 09/18/16 1057 7   Constitutional: Alert and oriented. Well appearing and in no acute distress. Eyes: Conjunctivae are normal.  Head: Atraumatic. Nose: No congestion/rhinnorhea. Mouth/Throat: Mucous membranes are moist.  Oropharynx non-erythematous. Neck: No stridor.   Cardiovascular: Normal rate, regular rhythm. Good peripheral circulation. Grossly normal heart sounds.   Respiratory: Normal respiratory effort.  No retractions. Lungs CTAB. Gastrointestinal: Soft and nontender. No distention. No CVA tenderness.  Musculoskeletal: No lower extremity tenderness nor edema. No gross deformities of extremities. Neurologic:  Normal speech and language. No gross focal neurologic deficits are appreciated.  Skin:  Skin is warm, dry and intact. No rash noted.   ____________________________________________   LABS (all labs ordered are listed, but only abnormal results are displayed)  Labs Reviewed  COMPREHENSIVE METABOLIC PANEL - Abnormal; Notable for the following:       Result Value   Calcium 8.8 (*)    ALT 10 (*)    All other components within normal limits  CBC WITH DIFFERENTIAL/PLATELET - Abnormal; Notable for the following:    RBC 3.34 (*)    Hemoglobin 11.4 (*)    HCT 34.0 (*)  MCV 101.8 (*)    MCH 34.1 (*)    Monocytes Absolute 1.1 (*)    All other components within normal limits  URINALYSIS, ROUTINE W REFLEX MICROSCOPIC - Abnormal; Notable for the following:    APPearance TURBID (*)    Hgb urine dipstick LARGE (*)    Protein, ur 100 (*)    Nitrite POSITIVE (*)     Leukocytes, UA LARGE (*)    All other components within normal limits  URINALYSIS, MICROSCOPIC (REFLEX) - Abnormal; Notable for the following:    Bacteria, UA MANY (*)    Squamous Epithelial / LPF 0-5 (*)    All other components within normal limits  PREGNANCY, URINE   ____________________________________________   PROCEDURES  Procedure(s) performed:   Procedures  None ____________________________________________   INITIAL IMPRESSION / ASSESSMENT AND PLAN / ED COURSE  Pertinent labs & imaging results that were available during my care of the patient were reviewed by me and considered in my medical decision making (see chart for details).  Patient presents to the emergency department for evaluation of lower back pain and urinary frequency. No signs or symptoms to suggest urosepsis or pyelonephritis. Patient with no vaginal bleeding or discharge. She is describing urinary frequency which may be from urinary tract infection. Patient also with mild, diffuse abdominal discomfort on exam. Because of this we'll obtain CMP and CBC.   Normal labs. UTI on UA. Pt does have diffuse mild back pain but no clinical symptoms to support pyelonephritis diagnosis. Plan for outpatient abx and PCP follow up.   At this time, I do not feel there is any life-threatening condition present. I have reviewed and discussed all results (EKG, imaging, lab, urine as appropriate), exam findings with patient. I have reviewed nursing notes and appropriate previous records.  I feel the patient is safe to be discharged home without further emergent workup. Discussed usual and customary return precautions. Patient and family (if present) verbalize understanding and are comfortable with this plan.  Patient will follow-up with their primary care provider. If they do not have a primary care provider, information for follow-up has been provided to them. All questions have been  answered.  ____________________________________________  FINAL CLINICAL IMPRESSION(S) / ED DIAGNOSES  Final diagnoses:  Acute cystitis without hematuria    MEDICATIONS GIVEN DURING THIS VISIT:  Medications - No data to display   NEW OUTPATIENT MEDICATIONS STARTED DURING THIS VISIT:  Discharge Medication List as of 09/18/2016 11:55 AM    START taking these medications   Details  cephALEXin (KEFLEX) 500 MG capsule Take 1 capsule (500 mg total) by mouth 2 (two) times daily., Starting Wed 09/18/2016, Until Wed 09/25/2016, Print          Note:  This document was prepared using Dragon voice recognition software and may include unintentional dictation errors.  Alona Bene, MD Emergency Medicine   Maia Plan, MD 09/18/16 [phone]

## 2016-09-18 NOTE — Discharge Instructions (Signed)

## 2016-11-21 ENCOUNTER — Emergency Department (HOSPITAL_BASED_OUTPATIENT_CLINIC_OR_DEPARTMENT_OTHER)
Admission: EM | Admit: 2016-11-21 | Discharge: 2016-11-21 | Disposition: A | Discharge status: Home / Self Care | Payer: Medicaid Other | Attending: Emergency Medicine | Admitting: Emergency Medicine

## 2016-11-21 ENCOUNTER — Emergency Department (HOSPITAL_BASED_OUTPATIENT_CLINIC_OR_DEPARTMENT_OTHER): Payer: Medicaid Other

## 2016-11-21 ENCOUNTER — Encounter (HOSPITAL_BASED_OUTPATIENT_CLINIC_OR_DEPARTMENT_OTHER): Payer: Self-pay | Admitting: Emergency Medicine

## 2016-11-21 DIAGNOSIS — N3000 Acute cystitis without hematuria: Secondary | ICD-10-CM | POA: Diagnosis not present

## 2016-11-21 DIAGNOSIS — Z3A01 Less than 8 weeks gestation of pregnancy: Secondary | ICD-10-CM | POA: Insufficient documentation

## 2016-11-21 DIAGNOSIS — Z87891 Personal history of nicotine dependence: Secondary | ICD-10-CM | POA: Insufficient documentation

## 2016-11-21 DIAGNOSIS — R1030 Lower abdominal pain, unspecified: Secondary | ICD-10-CM | POA: Diagnosis present

## 2016-11-21 DIAGNOSIS — O0001 Abdominal pregnancy with intrauterine pregnancy: Secondary | ICD-10-CM | POA: Diagnosis not present

## 2016-11-21 LAB — URINALYSIS, ROUTINE W REFLEX MICROSCOPIC
Bilirubin Urine: NEGATIVE
GLUCOSE, UA: NEGATIVE mg/dL
Ketones, ur: NEGATIVE mg/dL
Nitrite: POSITIVE — AB
PH: 6 (ref 5.0–8.0)
PROTEIN: NEGATIVE mg/dL
SPECIFIC GRAVITY, URINE: 1.023 (ref 1.005–1.030)

## 2016-11-21 LAB — URINALYSIS, MICROSCOPIC (REFLEX)

## 2016-11-21 LAB — PREGNANCY, URINE: Preg Test, Ur: POSITIVE — AB

## 2016-11-21 LAB — HCG, QUANTITATIVE, PREGNANCY: hCG, Beta Chain, Quant, S: 86156 m[IU]/mL — ABNORMAL HIGH (ref <5)

## 2016-11-21 MED ORDER — CEPHALEXIN 500 MG PO CAPS: 500.0000 mg | ORAL_CAPSULE | Freq: Four times a day (QID) | ORAL | 0 refills | Status: DC

## 2016-11-21 MED ORDER — CEPHALEXIN 250 MG PO CAPS
500.0000 mg | ORAL_CAPSULE | Freq: Once | ORAL | Status: AC
  Administered 2016-11-21: 500 mg via ORAL
  Filled 2016-11-21: qty 2

## 2016-11-21 NOTE — ED Triage Notes (Addendum)
Patient states that she is having right flank pain x 2 -3 days. She took a home preg test at home and she thinks she may be [redacted] weeks along, Reports that she has had a tubal pregnancy in the past.

## 2016-11-21 NOTE — ED Notes (Signed)
Patient transported to Ultrasound 

## 2016-11-21 NOTE — ED Notes (Addendum)
Patient called x 3 times. No answer and patient not seen in the waiting room.

## 2016-11-21 NOTE — ED Provider Notes (Signed)
[facility] Provider Note   CSN: [id number] Arrival date & time: 11/21/16  1712  By signing my name below, I, Vista Minkobert Ross, attest that this documentation has been prepared under the direction and in the presence of Rolland PorterJames, Madigan Rosensteel, MD. Electronically signed, Vista Minkobert Ross, ED Scribe. 11/21/16. 9:13 PM.  History   Chief Complaint Chief Complaint  Patient presents with  . Abdominal Pain    HPI HPI Comments: Catherine Mosley is a 31 y.o. female who presents to the Emergency Department complaining of suprapubic abdominal pain with associated lower back pain that started approximately four days ago. Pt reports that she took a pregnancy test two weeks ago and learned that she was pregnant and suspects that she has been so for several weeks. No exacerbating or alleviating factors noted to her abdominal pain. No treatments tried at home. Pt is [redacted] weeks pregnant and states that she has a follow up appointment scheduled with Dr. Delf[facility]right at [facility]. No nausea or vomiting.   The history is provided by the patient. No language interpreter was used.    History reviewed. No pertinent past medical history.  There are no active problems to display for this patient.   History reviewed. No pertinent surgical history.  OB History    Gravida Para Term Preterm AB Living   1             SAB TAB Ectopic Multiple Live Births                  Home Medications    Prior to Admission medications   Medication Sig Start Date End Date Taking? Authorizing Provider  acetaminophen (TYLENOL) 325 MG tablet Take 2 tablets (650 mg total) by mouth every 6 (six) hours as needed. 07/29/16   Audry PiliMohr, Tyler, PA-C  cephALEXin (KEFLEX) 500 MG capsule Take 1 capsule (500 mg total) by mouth 4 (four) times daily. 11/21/16   Rolland PorterJames, Mekhi Lascola, MD  ibuprofen (ADVIL,MOTRIN) 600 MG tablet Take 1 tablet (600 mg total) by mouth every 6 (six) hours as needed. 07/29/16   Audry PiliMohr, Tyler, PA-C    Family History History reviewed. No  pertinent family history.  Social History Social History  Substance Use Topics  . Smoking status: Former Games developermoker  . Smokeless tobacco: Never Used  . Alcohol use No     Allergies   Patient has no known allergies.   Review of Systems Review of Systems  Constitutional: Negative for appetite change, chills, diaphoresis, fatigue and fever.  HENT: Negative for mouth sores, sore throat and trouble swallowing.   Eyes: Negative for visual disturbance.  Respiratory: Negative for cough, chest tightness, shortness of breath and wheezing.   Cardiovascular: Negative for chest pain.  Gastrointestinal: Positive for abdominal pain (suprapubic). Negative for abdominal distention, diarrhea, nausea and vomiting.  Endocrine: Negative for polydipsia, polyphagia and polyuria.  Genitourinary: Negative for dysuria, frequency and hematuria.  Musculoskeletal: Positive for back pain (lower). Negative for gait problem.  Skin: Negative for color change, pallor and rash.  Neurological: Negative for dizziness, syncope, light-headedness and headaches.  Hematological: Does not bruise/bleed easily.  Psychiatric/Behavioral: Negative for behavioral problems and confusion.  All other systems reviewed and are negative.   Physical Exam Updated Vital Signs BP 112/64 (BP Location: Right Arm)   Pulse 65   Temp 98.8 F (37.1 C) (Oral)   Resp 18   Ht 5\' 4"  (1.626 m)   Wt 58.3 kg (128 lb 8.5 oz)   LMP 09/30/2016   SpO2 100%  BMI 22.06 kg/m   Physical Exam  Constitutional: She is oriented to person, place, and time. She appears well-developed and well-nourished. No distress.  HENT:  Head: Normocephalic.  Eyes: Conjunctivae are normal. Pupils are equal, round, and reactive to light. No scleral icterus.  Neck: Normal range of motion. Neck supple. No thyromegaly present.  Cardiovascular: Normal rate and regular rhythm.  Exam reveals no gallop and no friction rub.   No murmur heard. Pulmonary/Chest: Effort  normal and breath sounds normal. No respiratory distress. She has no wheezes. She has no rales.  Abdominal: Soft. Bowel sounds are normal. She exhibits no distension. There is no tenderness. There is no rebound.  Musculoskeletal: Normal range of motion.  Neurological: She is alert and oriented to person, place, and time.  Skin: Skin is warm and dry. No rash noted.  Psychiatric: She has a normal mood and affect. Her behavior is normal.    ED Treatments / Results  DIAGNOSTIC STUDIES: Oxygen Saturation is 100% on RA, normal by my interpretation.  COORDINATION OF CARE: 8:03 PM-Discussed treatment plan with pt at bedside and pt agreed to plan.   Labs (all labs ordered are listed, but only abnormal results are displayed) Labs Reviewed  PREGNANCY, URINE - Abnormal; Notable for the following:       Result Value   Preg Test, Ur POSITIVE (*)    All other components within normal limits  URINALYSIS, ROUTINE W REFLEX MICROSCOPIC - Abnormal; Notable for the following:    APPearance CLOUDY (*)    Hgb urine dipstick TRACE (*)    Nitrite POSITIVE (*)    Leukocytes, UA MODERATE (*)    All other components within normal limits  URINALYSIS, MICROSCOPIC (REFLEX) - Abnormal; Notable for the following:    Bacteria, UA MANY (*)    Squamous Epithelial / LPF 0-5 (*)    All other components within normal limits  URINE CULTURE  HCG, QUANTITATIVE, PREGNANCY    EKG  EKG Interpretation None       Radiology US Ob Comp Less 14 Wks  Result Date: 11/21/2016 CLINICAL DATA:  31 year old pregnant female with right pelvic pain for 3 days. Estimated gestational age of [redacted] weeks 3 days by LMP EXAM: OBSTETRIC <14 WK ULTRASOUND TECHNIQUE: Transabdominal ultrasound was performed for evaluation of the gestation as well as the maternal uterus and adnexal regions. COMPARISON:  None. FINDINGS: Intrauterine gestational sac: Single Yolk sac:  Visualized. Embryo:  Visualized. Cardiac Activity: Visualized. Heart Rate: 165  bpm CRL:   14  mm   7 w 5 d                  [place] EDC: 07/05/2017 Subchorionic hemorrhage:  None visualized. Maternal uterus/adnexae: The ovaries bilaterally are unremarkable. No adnexal mass or free fluid IMPRESSION: Single living intrauterine gestation with estimated gestational age of [redacted] weeks 5 days by this ultrasound. No subchorionic hemorrhage. Electronically Signed   By: Harmon Pier M.D.   On: 11/21/2016 20:56    Procedures Procedures (including critical care time)  Medications Ordered in ED Medications  cephALEXin (KEFLEX) capsule 500 mg (not administered)     Initial Impression / Assessment and Plan / ED Course  I have reviewed the triage vital signs and the nursing notes.  Pertinent labs & imaging results that were available during my care of the patient were reviewed by me and considered in my medical decision making (see chart for details).    Minimal tenderness suprapubic region on exam. Bleeding or  discharge. Intrauterine pregnancy on ultrasound. Pyuria and some mild dysuria. We'll culture. Given Keflex here. Has OB appointment on Tuesday 5 days from now.  Final Clinical Impressions(s) / ED Diagnoses   Final diagnoses:  Less than [redacted] weeks gestation of pregnancy  Acute cystitis without hematuria    New Prescriptions New Prescriptions   CEPHALEXIN (KEFLEX) 500 MG CAPSULE    Take 1 capsule (500 mg total) by mouth 4 (four) times daily.  I personally performed the services described in this documentation, which was scribed in my presence. The recorded information has been reviewed and is accurate.     Rolland Porter, MD 11/21/16 2127

## 2016-11-21 NOTE — ED Notes (Signed)
Patient just walked back into waiting room from [facility].

## 2016-11-21 NOTE — Discharge Instructions (Signed)
Follow-up with your OB/GYN on Tuesday as scheduled.

## 2016-11-24 LAB — URINE CULTURE: Culture: >=100000 — AB

## 2016-11-25 ENCOUNTER — Telehealth: Payer: Self-pay | Admitting: Emergency Medicine

## 2016-11-25 NOTE — Telephone Encounter (Signed)
Post ED Visit - Positive Culture Follow-up  Culture report reviewed by antimicrobial stewardship pharmacist:  []  Enzo BiNathan Batchelder, Pharm.D. []  Celedonio MiyamotoJeremy Frens, Pharm.D., BCPS AQ-ID [x]  Garvin FilaMike Maccia, Pharm.D., BCPS []  Georgina PillionElizabeth Martin, Pharm.D., BCPS []  Pistakee HighlandsMinh Pham, 17[address] Rainbow BoulevardPharm.D., BCPS, AAHIVP []  Estella HuskMichelle Turner, Pharm.D., BCPS, AAHIVP []  Lysle Pearlachel Rumbarger, PharmD, BCPS []  Casilda Carlsaylor Stone, PharmD, BCPS []  Pollyann SamplesAndy Johnston, PharmD, BCPS  Positive urine culture Treated with cephalexin, organism sensitive to the same and no further patient follow-up is required at this time.  Berle MullMiller, Nysia Dell 11/25/2016, 11:17 AM

## 2016-12-30 ENCOUNTER — Emergency Department (HOSPITAL_BASED_OUTPATIENT_CLINIC_OR_DEPARTMENT_OTHER)
Admission: EM | Admit: 2016-12-30 | Discharge: 2016-12-30 | Disposition: A | Discharge status: Home / Self Care | Payer: Medicaid Other | Attending: Emergency Medicine | Admitting: Emergency Medicine

## 2016-12-30 ENCOUNTER — Encounter (HOSPITAL_BASED_OUTPATIENT_CLINIC_OR_DEPARTMENT_OTHER): Payer: Self-pay

## 2016-12-30 DIAGNOSIS — M545 Low back pain: Secondary | ICD-10-CM | POA: Diagnosis present

## 2016-12-30 DIAGNOSIS — Z3A13 13 weeks gestation of pregnancy: Secondary | ICD-10-CM | POA: Insufficient documentation

## 2016-12-30 DIAGNOSIS — Z87891 Personal history of nicotine dependence: Secondary | ICD-10-CM | POA: Diagnosis not present

## 2016-12-30 DIAGNOSIS — O2312 Infections of bladder in pregnancy, second trimester: Secondary | ICD-10-CM | POA: Insufficient documentation

## 2016-12-30 DIAGNOSIS — N3000 Acute cystitis without hematuria: Secondary | ICD-10-CM

## 2016-12-30 LAB — URINALYSIS, ROUTINE W REFLEX MICROSCOPIC
Bilirubin Urine: NEGATIVE
GLUCOSE, UA: NEGATIVE mg/dL
KETONES UR: 15 mg/dL — AB
Nitrite: NEGATIVE
PH: 6.5 (ref 5.0–8.0)
PROTEIN: 100 mg/dL — AB
Specific Gravity, Urine: 1.019 (ref 1.005–1.030)

## 2016-12-30 LAB — URINALYSIS, MICROSCOPIC (REFLEX)

## 2016-12-30 MED ORDER — LIDOCAINE HCL (PF) 1 % IJ SOLN
2.0000 mL | Freq: Once | INTRAMUSCULAR | Status: AC
  Administered 2016-12-30: 2 mL

## 2016-12-30 MED ORDER — CEPHALEXIN 500 MG PO CAPS: 500.0000 mg | ORAL_CAPSULE | Freq: Two times a day (BID) | ORAL | 0 refills | Status: AC

## 2016-12-30 MED ORDER — CEFTRIAXONE SODIUM 1 G IJ SOLR
1.0000 g | Freq: Once | INTRAMUSCULAR | Status: AC
  Administered 2016-12-30: 1 g via INTRAMUSCULAR
  Filled 2016-12-30: qty 10

## 2016-12-30 MED ORDER — LIDOCAINE HCL 1 % IJ SOLN
INTRAMUSCULAR | Status: AC
  Filled 2016-12-30: qty 10

## 2016-12-30 NOTE — Discharge Instructions (Signed)
You have been seen in the Emergency Department (ED) today for pain when urinating.  Your workup today suggests that you have a urinary tract infection (UTI).  Please take your antibiotic as prescribed and over-the-counter pain medication (Tylenol) as needed, but no more than recommended on the label instructions.  Drink PLENTY of fluids.  Call your OB/Gyn to schedule the next available appointment to follow up on today?s ED visit, or return immediately to the ED if your pain worsens, you have decreased urine production, develop fever, persistent vomiting, or other symptoms that concern you.

## 2016-12-30 NOTE — ED Provider Notes (Signed)
Emergency Department Provider Note   I have reviewed the triage vital signs and the nursing notes.   HISTORY  Chief Complaint Back Pain   HPI Catherine Mosley is a 31 y.o. female currently [redacted]wk pregnant presents to the emergency department for evaluation of lower back discomfort similar to prior kidney infections. Patient states that symptoms began this morning and were causing her significant discomfort at work. She took a Tylenol which provided some mild relief in symptoms. She denies any dysuria, hesitancy, urgency. No vaginal bleeding or discharge. No complications in her pregnancy to this point. No fevers or chills. She denies significant unilateral symptoms and reports the pain radiating across her entire lower back. No modifying factors.   History reviewed. No pertinent past medical history.  There are no active problems to display for this patient.   History reviewed. No pertinent surgical history.    Allergies Patient has no known allergies.  No family history on file.  Social History Social History  Substance Use Topics  . Smoking status: Former Games developer  . Smokeless tobacco: Never Used  . Alcohol use No    Review of Systems  Constitutional: No fever/chills Eyes: No visual changes. ENT: No sore throat. Cardiovascular: Denies chest pain. Respiratory: Denies shortness of breath. Gastrointestinal: No abdominal pain.  No nausea, no vomiting.  No diarrhea.  No constipation. Genitourinary: Negative for dysuria. Musculoskeletal: Positive for back pain. Skin: Negative for rash. Neurological: Negative for headaches, focal weakness or numbness.  10-point ROS otherwise negative.  ____________________________________________   PHYSICAL EXAM:  VITAL SIGNS: ED Triage Vitals  Enc Vitals Group     BP 12/30/16 1120 101/65     Pulse Rate 12/30/16 1120 80     Resp 12/30/16 1120 18     Temp 12/30/16 1120 98.5 F (36.9 C)     Temp Source 12/30/16 1120 Oral   SpO2 12/30/16 1120 100 %     Weight 12/30/16 1120 132 lb 4.4 oz (60 kg)     Height 12/30/16 1120 5\' 4"  (1.626 m)     Pain Score 12/30/16 1119 7   Constitutional: Alert and oriented. Well appearing and in no acute distress. Eyes: Conjunctivae are normal.  Head: Atraumatic. Nose: No congestion/rhinnorhea. Mouth/Throat: Mucous membranes are moist.  Oropharynx non-erythematous. Neck: No stridor.   Cardiovascular: Normal rate, regular rhythm. Good peripheral circulation. Grossly normal heart sounds.   Respiratory: Normal respiratory effort.  No retractions. Lungs CTAB. Gastrointestinal: Soft and nontender. No distention. No CVA tenderness.  Musculoskeletal: No lower extremity tenderness nor edema. No gross deformities of extremities. Neurologic:  Normal speech and language. No gross focal neurologic deficits are appreciated.  Skin:  Skin is warm, dry and intact. No rash noted.  ____________________________________________   LABS (all labs ordered are listed, but only abnormal results are displayed)  Labs Reviewed  URINALYSIS, ROUTINE W REFLEX MICROSCOPIC - Abnormal; Notable for the following:       Result Value   APPearance TURBID (*)    Hgb urine dipstick SMALL (*)    Ketones, ur 15 (*)    Protein, ur 100 (*)    Leukocytes, UA LARGE (*)    All other components within normal limits  URINALYSIS, MICROSCOPIC (REFLEX) - Abnormal; Notable for the following:    Bacteria, UA MANY (*)    Squamous Epithelial / LPF 0-5 (*)    All other components within normal limits  URINE CULTURE   ____________________________________________  RADIOLOGY  None ____________________________________________   PROCEDURES  Procedure(s) performed:  Procedures  None ____________________________________________   INITIAL IMPRESSION / ASSESSMENT AND PLAN / ED COURSE  Pertinent labs & imaging results that were available during my care of the patient were reviewed by me and considered in my medical  decision making (see chart for details).  Patient presents to the emergency department for evaluation of lower back pain that feels similar to prior kidney infections. The patient is [redacted] weeks pregnant. No vaginal bleeding or other symptoms. Patient has normal exam. No fevers or chills. No clinical evidence to suggest pyelonephritis.  12:00 PM Patient with evidence of UTI on UA. View of prior cultures show Escherichia coli in the recent past sensitive to cephalosporins. Plan for Rocephin prior to discharge and treatment with Keflex at home. Plan for OB/GYN follow-up. Strict return precautions discussed.   At this time, I do not feel there is any life-threatening condition present. I have reviewed and discussed all results (EKG, imaging, lab, urine as appropriate), exam findings with patient. I have reviewed nursing notes and appropriate previous records.  I feel the patient is safe to be discharged home without further emergent workup. Discussed usual and customary return precautions. Patient and family (if present) verbalize understanding and are comfortable with this plan.  Patient will follow-up with their primary care provider. If they do not have a primary care provider, information for follow-up has been provided to them. All questions have been answered.  ____________________________________________  FINAL CLINICAL IMPRESSION(S) / ED DIAGNOSES  Final diagnoses:  Acute cystitis without hematuria     MEDICATIONS GIVEN DURING THIS VISIT:  Medications  cefTRIAXone (ROCEPHIN) injection 1 g (1 g Intramuscular Given 12/30/16 1206)  lidocaine (PF) (XYLOCAINE) 1 % injection 2 mL (2 mLs Other Given 12/30/16 1209)     NEW OUTPATIENT MEDICATIONS STARTED DURING THIS VISIT:  Discharge Medication List as of 12/30/2016 12:00 PM    START taking these medications   Details  cephALEXin (KEFLEX) 500 MG capsule Take 1 capsule (500 mg total) by mouth 2 (two) times daily., Starting Mon 12/30/2016, Until Mon  01/06/2017, Print          Note:  This document was prepared using Dragon voice recognition software and may include unintentional dictation errors.  Alona BeneJoshua Pamelia Botto, MD Emergency Medicine    Kutter Schnepf, Arlyss RepressJoshua G, MD 12/30/16 [phone]

## 2016-12-30 NOTE — ED Triage Notes (Signed)
C/o lower back pain started yesterday-urinary freq-denies vaginal d/c and bleeding -pt is [redacted] weeks pregnant-NAD-steady gait

## 2017-01-01 LAB — URINE CULTURE: Special Requests: NORMAL

## 2017-01-02 ENCOUNTER — Telehealth: Payer: Self-pay | Admitting: *Deleted

## 2017-01-02 NOTE — Telephone Encounter (Signed)
Post ED Visit - Positive Culture Follow-up  Culture report reviewed by antimicrobial stewardship pharmacist:  []  Enzo BiNathan Batchelder, Pharm.D. []  Celedonio MiyamotoJeremy Frens, Pharm.D., BCPS AQ-ID []  Garvin FilaMike Maccia, Pharm.D., BCPS []  Georgina PillionElizabeth Martin, Pharm.D., BCPS []  LyonsMinh Pham, [place]Pharm.D., BCPS, AAHIVP []  Estella HuskMichelle Turner, Pharm.D., BCPS, AAHIVP []  Lysle Pearlachel Rumbarger, PharmD, BCPS []  Casilda Carlsaylor Stone, PharmD, BCPS []  Pollyann SamplesAndy Johnston, PharmD, BCPS Donnella Biyler Colvard, PharmD  Positive urine culture Treated with Cephalexin, organism sensitive to the same and no further patient follow-up is required at this time.  Virl AxeRobertson, Tersa Fotopoulos Talley 01/02/2017, 11:13 AM

## 2017-07-02 IMAGING — CT CT ABD-PELV W/ CM
2 of 4 series · 16 of 46 positions shown, 18 images · IV contrast (APPLIED)
Comparison: None.

CLINICAL DATA: Pt with acute onset RLQ abdominal pain and Right
flank pain.

EXAM:
CT ABDOMEN AND PELVIS WITH CONTRAST
TECHNIQUE: Multidetector CT imaging of the abdomen and pelvis was performed
using the standard protocol following bolus administration of
intravenous contrast.
CONTRAST:  100mL POP07J-ZRR IOPAMIDOL (POP07J-ZRR) INJECTION 61%

[Series 2: axial st · axial · 0.70mm/px · z∈[-452,-56]mm · 13 of 87 slices shown, 15 images]
[im 4/87  soft-tissue]
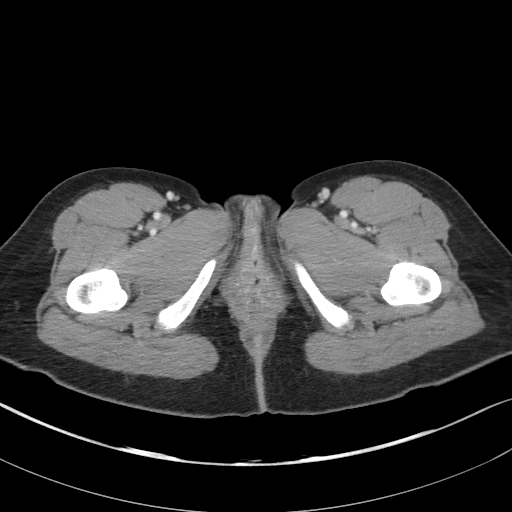
[im 4/87  bone]
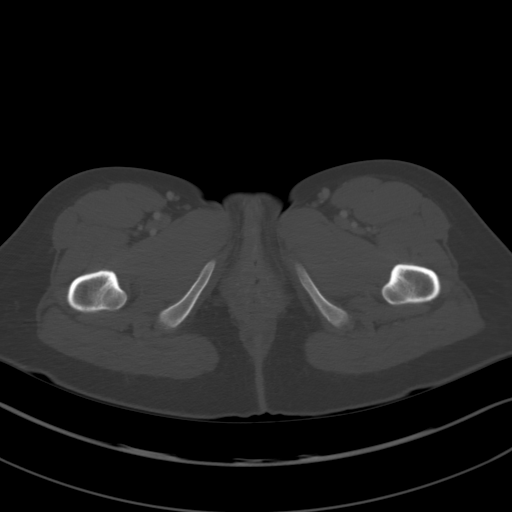
[im 11/87  soft-tissue]
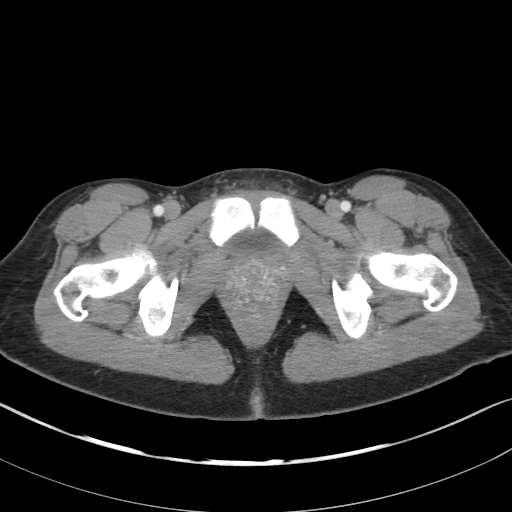
[im 18/87  soft-tissue]
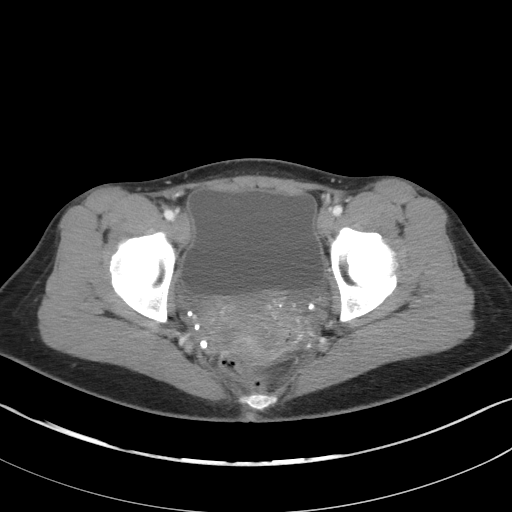
[im 26/87  soft-tissue]
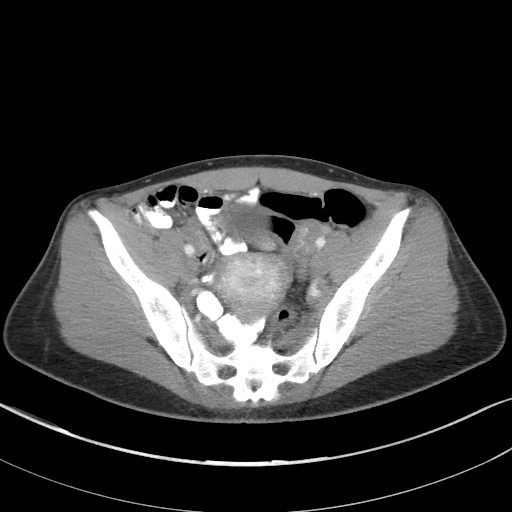
[im 29/87  soft-tissue]
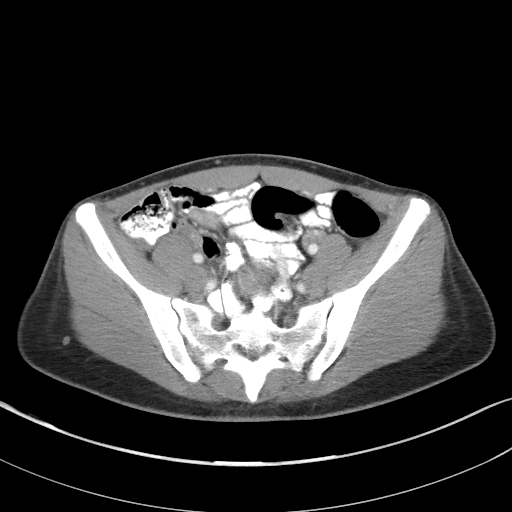
[im 36/87  soft-tissue]
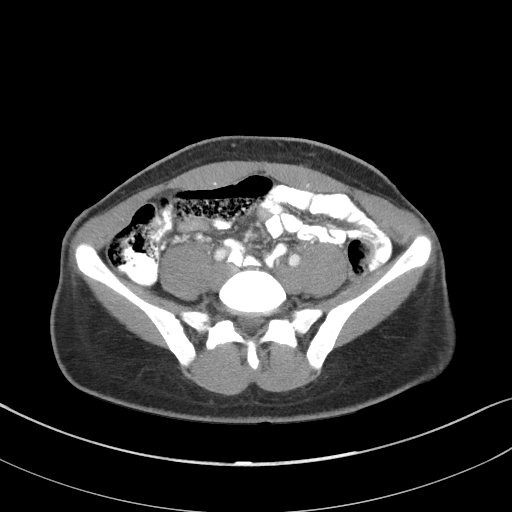
[im 44/87  soft-tissue]
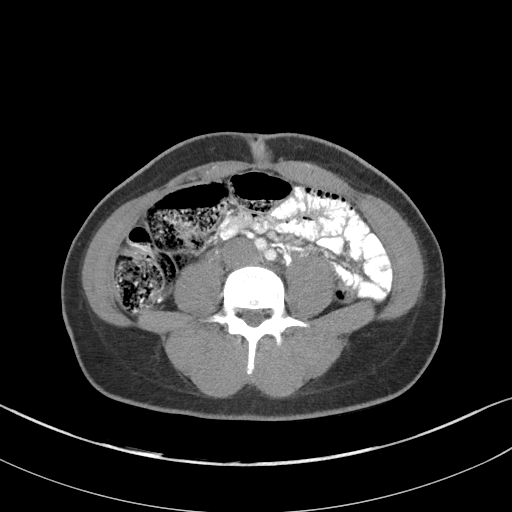
[im 51/87  soft-tissue]
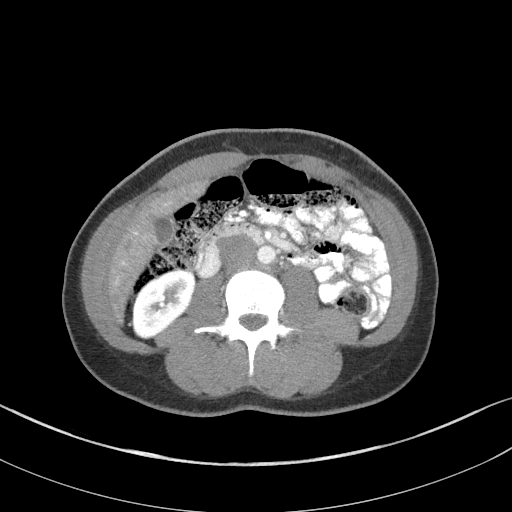
[im 58/87  soft-tissue]
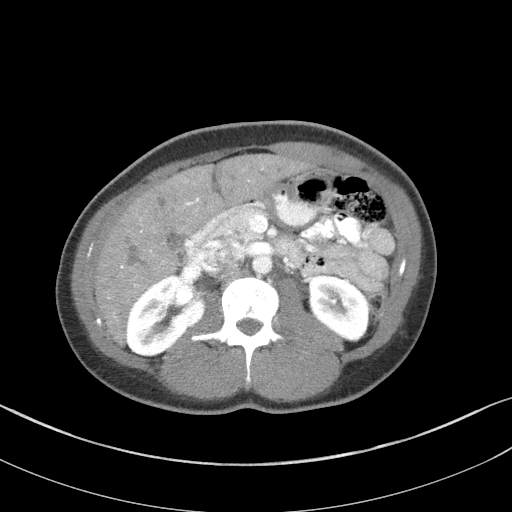
[im 58/87  bone]
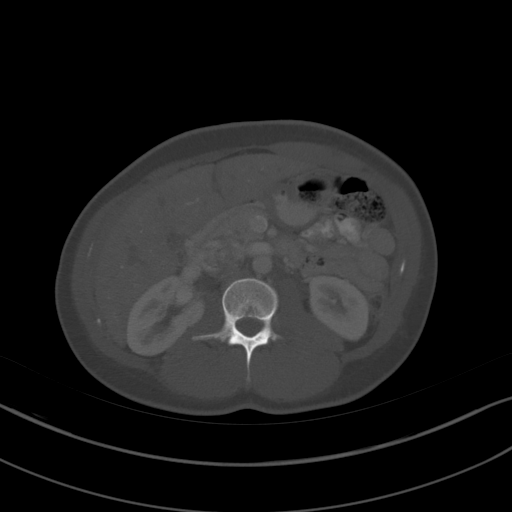
[im 61/87  soft-tissue]
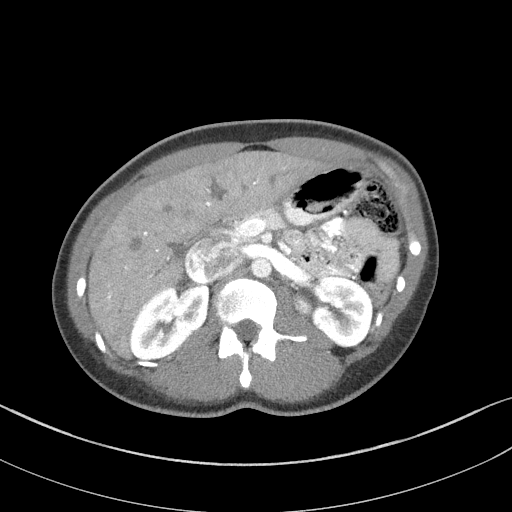
[im 69/87  soft-tissue]
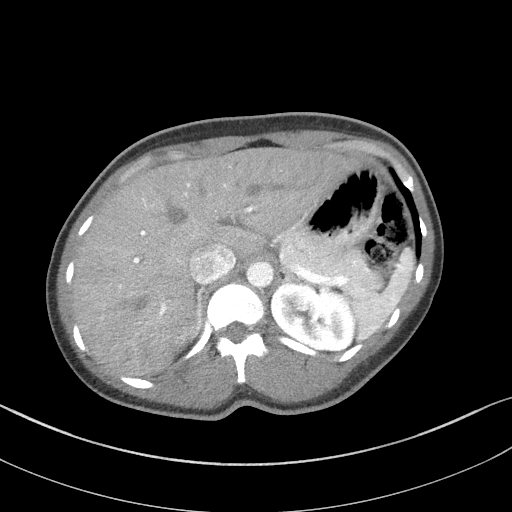
[im 76/87  soft-tissue]
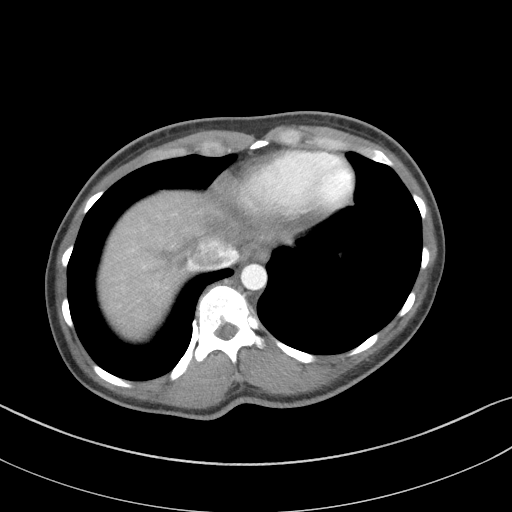
[im 83/87  soft-tissue]
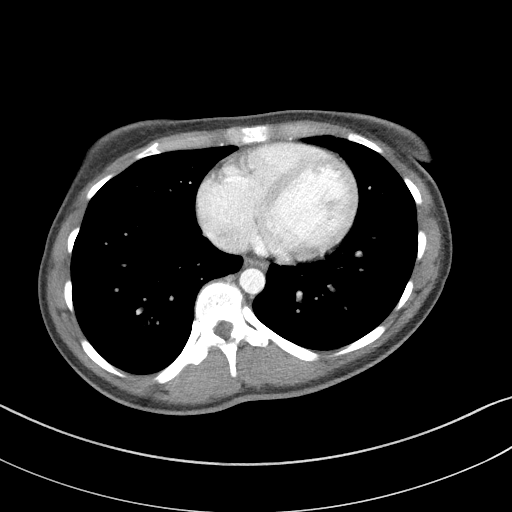

[Series 5: coronal st · coronal · 0.76mm/px · 3 of 68 slices shown]
[im 23/68  soft-tissue]
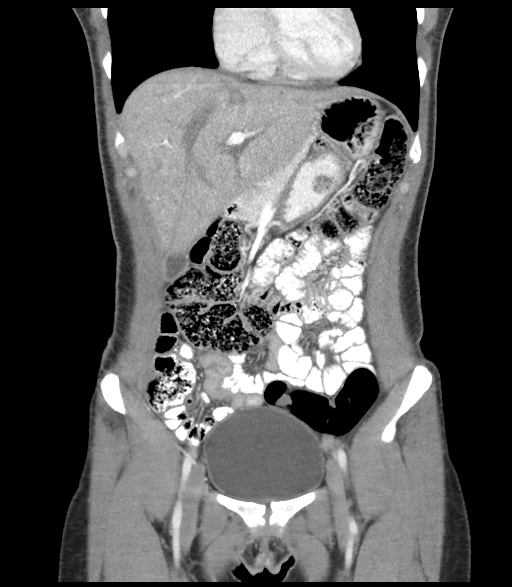
[im 30/68  soft-tissue]
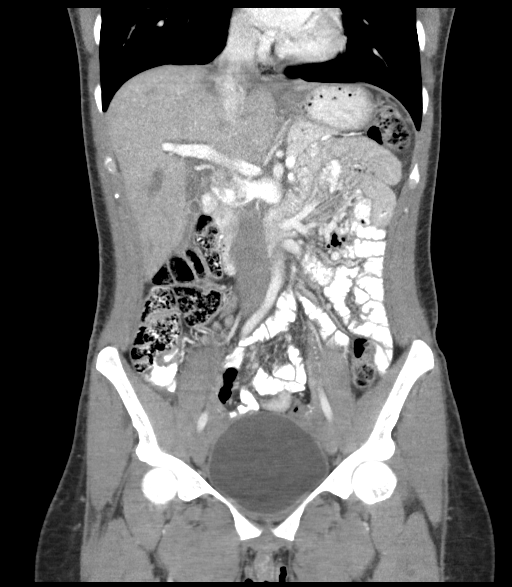
[im 38/68  soft-tissue]
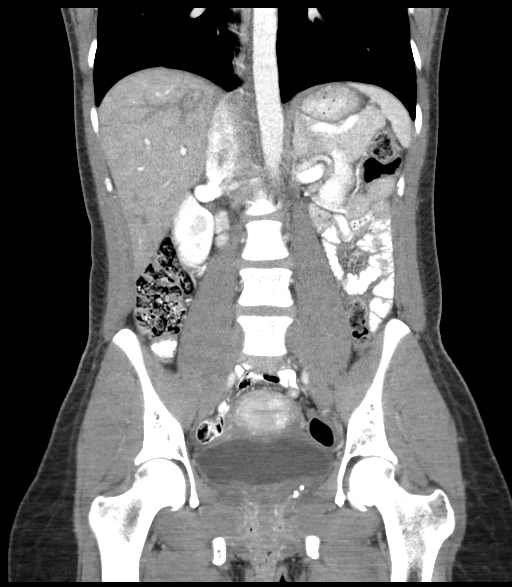

[16 of 46 positions shown; findings below may reference images not displayed]

FINDINGS: Lower chest: No acute abnormality.

Hepatobiliary: No focal liver abnormality is seen. No gallstones,
gallbladder wall thickening, or biliary dilatation.

Pancreas: Unremarkable. No pancreatic ductal dilatation or
surrounding inflammatory changes.

Spleen: Normal in size without focal abnormality.

Adrenals/Urinary Tract: Adrenal glands are unremarkable. Kidneys are
normal, without renal calculi, focal lesion, or hydronephrosis.
Bladder is unremarkable.

Stomach/Bowel: Stomach is within normal limits. Appendix appears
normal. No evidence of bowel wall thickening, distention, or
inflammatory changes. Large amount of stool in the ascending and
transverse colon. No pneumatosis, pneumoperitoneum or portal venous
gas.

Vascular/Lymphatic: No significant vascular findings are present. No
enlarged abdominal or pelvic lymph nodes.

Reproductive: Uterus and bilateral adnexa are unremarkable.

Other: No abdominal wall hernia or abnormality. No abdominopelvic
ascites.

Musculoskeletal: No acute osseous abnormality. No lytic or sclerotic
osseous lesion.
IMPRESSION: No acute abdominal or pelvic pathology.

Large amount of stool in the ascending and transverse colon.

## 2017-12-10 IMAGING — US US PELVIS COMPLETE
1 series · 13 of 25 positions shown · non-contrast
Comparison: None.

CLINICAL DATA: Right lower quadrant pain today, started menses
today. History of ectopic in 5732.

EXAM:
TRANSABDOMINAL AND TRANSVAGINAL ULTRASOUND OF PELVIS
DOPPLER ULTRASOUND OF OVARIES
TECHNIQUE: Both transabdominal and transvaginal ultrasound examinations of the
pelvis were performed. Transabdominal technique was performed for
global imaging of the pelvis including uterus, ovaries, adnexal
regions, and pelvic cul-de-sac.
It was necessary to proceed with endovaginal exam following the
transabdominal exam to visualize the adnexal structures to an
adequate degree.. Color and duplex Doppler ultrasound was utilized
to evaluate blood flow to the ovaries.

[Series 1: us pelvis complete · 0.21mm/px · 13 of 85 slices shown]
[im 1/85]
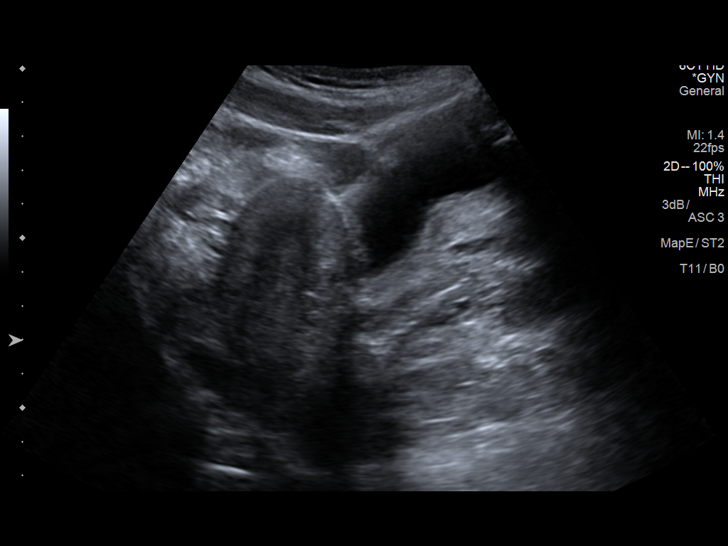
[im 8/85]
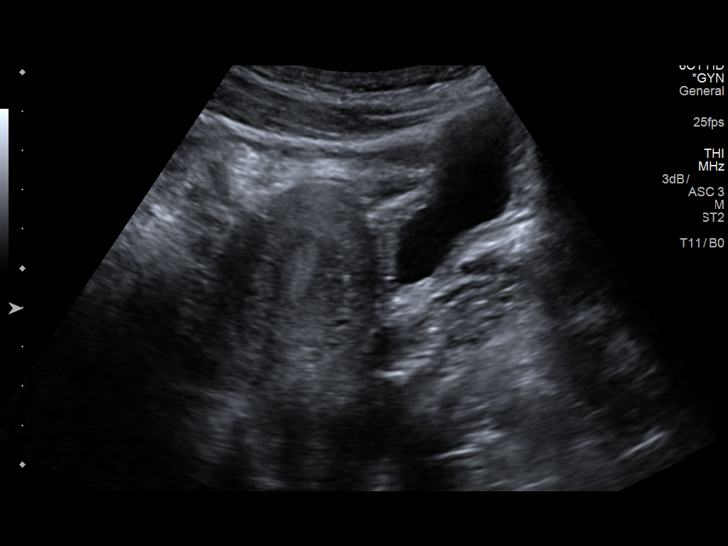
[im 15/85]
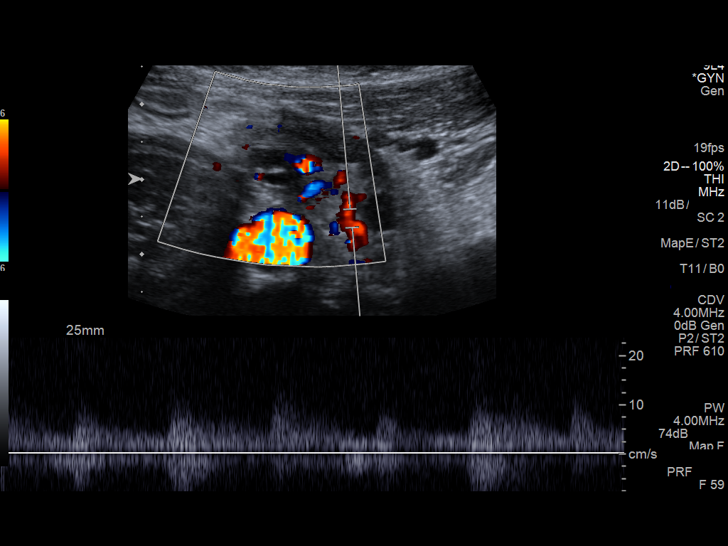
[im 22/85]
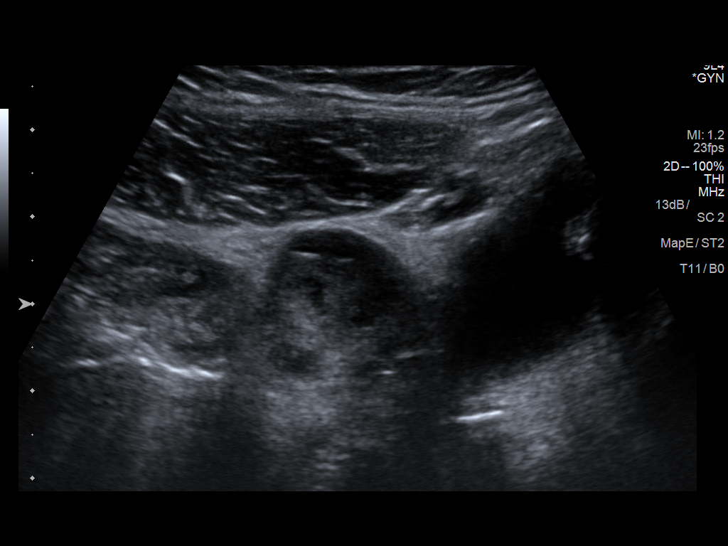
[im 29/85]
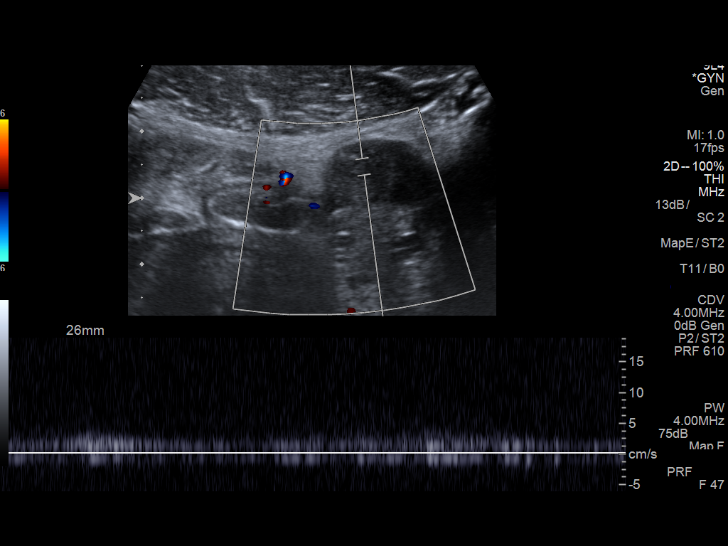
[im 36/85]
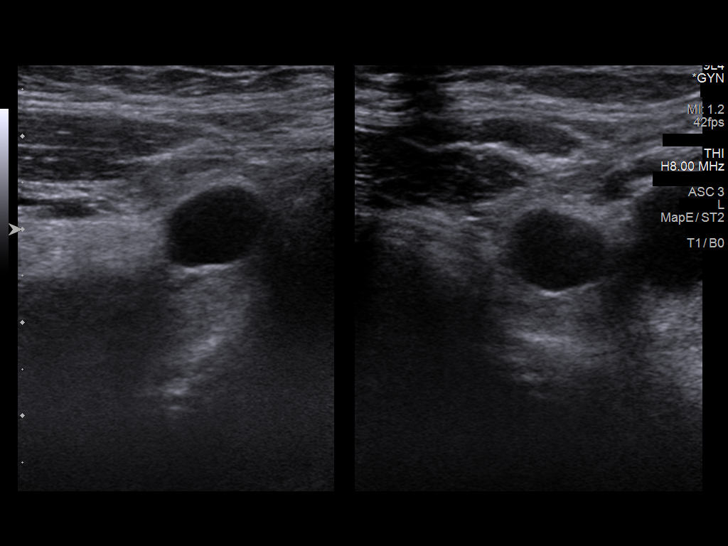
[im 43/85]
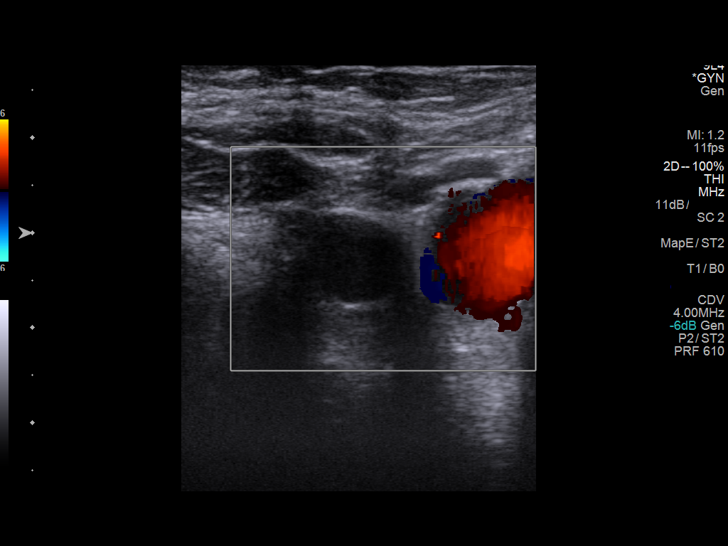
[im 50/85]
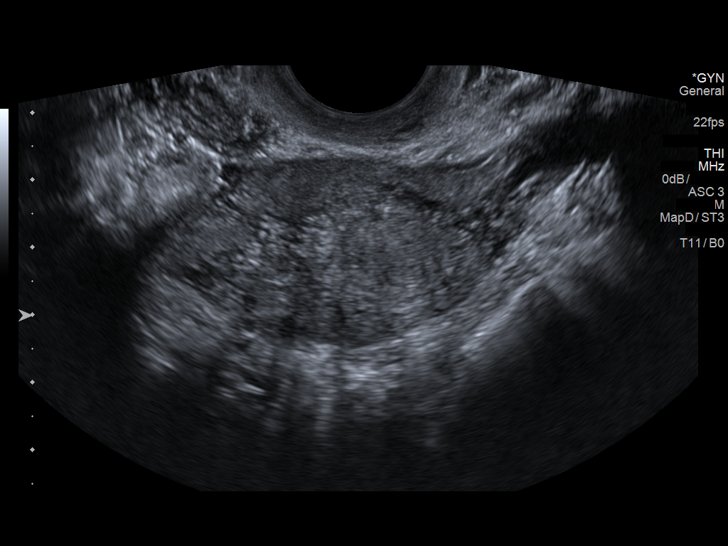
[im 57/85]
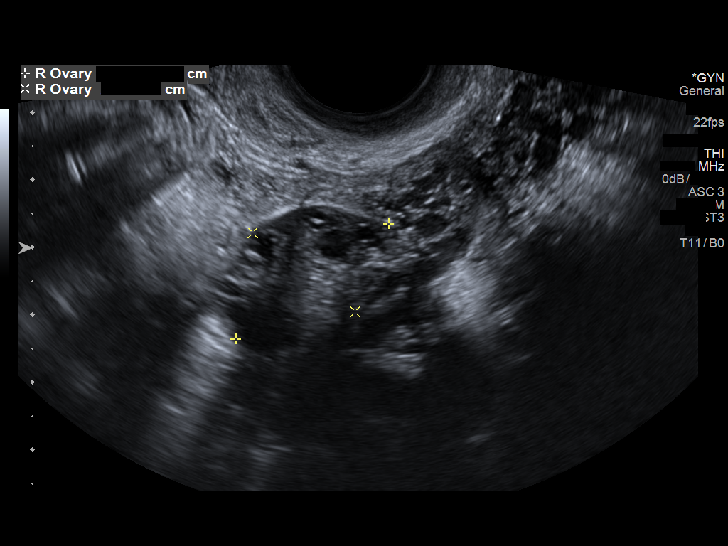
[im 64/85]
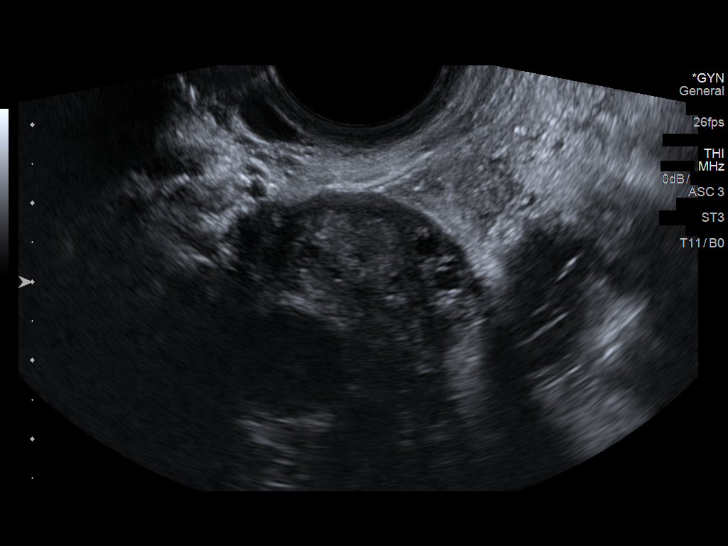
[im 71/85]
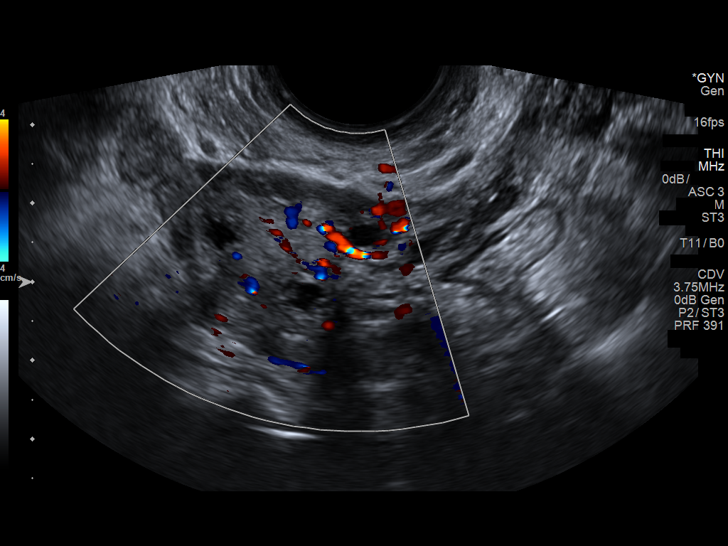
[im 78/85]
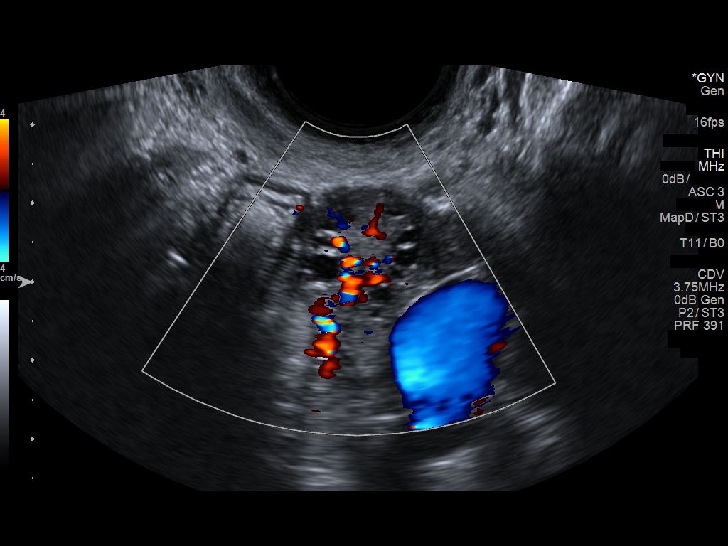
[im 85/85]
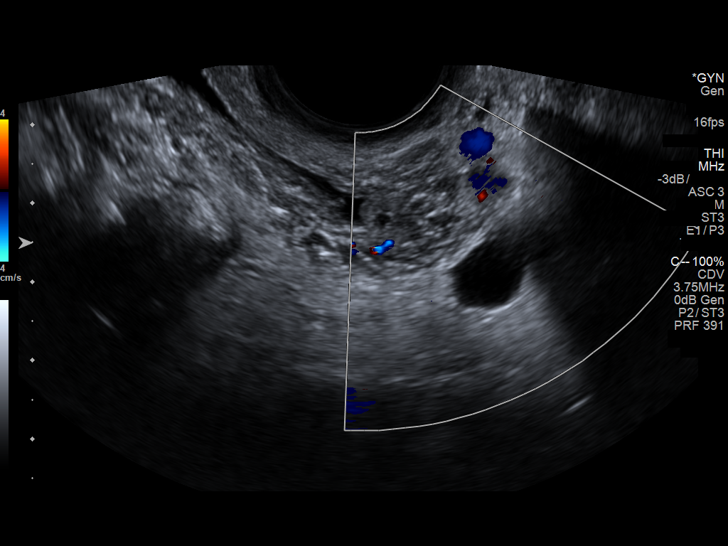

[13 of 25 positions shown; findings below may reference images not displayed]

FINDINGS: Uterus

Measurements: 8.7 x 3.4 x 5.1 cm. No fibroids or other mass
visualized.

Endometrium

Thickness: 4 mm.  No focal abnormality visualized.

Right ovary

Measurements: 2.8 x 1.9 x 1.8 cm. Normal appearance/no adnexal mass.

Left ovary

Measurements: 2.7 x 2 x 2 cm. Normal appearance/no adnexal mass.

Pulsed Doppler evaluation of both ovaries demonstrates normal
low-resistance arterial and venous waveforms.

Other findings

A small simple- appearing cystic structure within the left adnexa,
adjacent to the left ovary, measuring 1.1 x 0.8 cm, most likely
exophytic ovarian follicle or small paraovarian cyst.
IMPRESSION: 1. No acute findings. No source for right lower quadrant pain seen.
Right ovary appears normal and there is no mass or free fluid within
the right adnexal region. Uterus appears normal.
2. Small simple-appearing cystic structure within the left adnexa,
near the left ovary, measuring 1.1 cm, most likely a normal
exophytic ovarian follicle or benign paraovarian cyst.

## 2018-04-17 ENCOUNTER — Encounter (HOSPITAL_BASED_OUTPATIENT_CLINIC_OR_DEPARTMENT_OTHER): Payer: Self-pay

## 2018-04-17 ENCOUNTER — Emergency Department (HOSPITAL_BASED_OUTPATIENT_CLINIC_OR_DEPARTMENT_OTHER)
Admission: EM | Admit: 2018-04-17 | Discharge: 2018-04-17 | Disposition: A | Discharge status: Home / Self Care | Payer: Self-pay | Attending: Emergency Medicine | Admitting: Emergency Medicine

## 2018-04-17 ENCOUNTER — Other Ambulatory Visit: Payer: Self-pay

## 2018-04-17 DIAGNOSIS — R69 Illness, unspecified: Secondary | ICD-10-CM | POA: Insufficient documentation

## 2018-04-17 DIAGNOSIS — Z5321 Procedure and treatment not carried out due to patient leaving prior to being seen by health care provider: Secondary | ICD-10-CM | POA: Insufficient documentation

## 2018-04-17 NOTE — ED Triage Notes (Signed)
C/o vaginal abscess x 3 days-NAD-steady gait

## 2018-04-18 ENCOUNTER — Other Ambulatory Visit: Payer: Self-pay

## 2018-04-18 ENCOUNTER — Encounter (HOSPITAL_BASED_OUTPATIENT_CLINIC_OR_DEPARTMENT_OTHER): Payer: Self-pay | Admitting: Adult Health

## 2018-04-18 ENCOUNTER — Emergency Department (HOSPITAL_BASED_OUTPATIENT_CLINIC_OR_DEPARTMENT_OTHER)
Admission: EM | Admit: 2018-04-18 | Discharge: 2018-04-18 | Disposition: A | Discharge status: Home / Self Care | Payer: Self-pay | Attending: Emergency Medicine | Admitting: Emergency Medicine

## 2018-04-18 DIAGNOSIS — N764 Abscess of vulva: Secondary | ICD-10-CM | POA: Insufficient documentation

## 2018-04-18 DIAGNOSIS — Z87891 Personal history of nicotine dependence: Secondary | ICD-10-CM | POA: Insufficient documentation

## 2018-04-18 MED ORDER — DOXYCYCLINE HYCLATE 100 MG PO CAPS: 100.0000 mg | ORAL_CAPSULE | Freq: Two times a day (BID) | ORAL | 0 refills | Status: DC

## 2018-04-18 MED ORDER — LIDOCAINE HCL 1 % IJ SOLN
INTRAMUSCULAR | Status: AC
  Filled 2018-04-18: qty 20

## 2018-04-18 MED ORDER — IBUPROFEN 200 MG PO TABS
600.0000 mg | ORAL_TABLET | Freq: Once | ORAL | Status: AC
  Administered 2018-04-18: 12:00:00 600 mg via ORAL
  Filled 2018-04-18: qty 1

## 2018-04-18 MED ORDER — LIDOCAINE-EPINEPHRINE (PF) 2 %-1:200000 IJ SOLN
20.0000 mL | Freq: Once | INTRAMUSCULAR | Status: AC
  Administered 2018-04-18: 20 mL
  Filled 2018-04-18: qty 20

## 2018-04-18 MED ORDER — LIDOCAINE-EPINEPHRINE (PF) 2 %-1:200000 IJ SOLN
INTRAMUSCULAR | Status: AC
  Administered 2018-04-18: 20 mL
  Filled 2018-04-18: qty 10

## 2018-04-18 MED ORDER — IBUPROFEN 600 MG PO TABS: 600.0000 mg | ORAL_TABLET | Freq: Three times a day (TID) | ORAL | 0 refills | Status: DC | PRN

## 2018-04-18 MED ORDER — DOXYCYCLINE HYCLATE 100 MG PO TABS
100.0000 mg | ORAL_TABLET | Freq: Once | ORAL | Status: AC
  Administered 2018-04-18: 100 mg via ORAL
  Filled 2018-04-18: qty 1

## 2018-04-18 NOTE — ED Triage Notes (Signed)
Presents with a fluctuant drainag area to her right sided labia majora that began 2-3 days ago. Endorses pain.

## 2018-04-18 NOTE — ED Provider Notes (Signed)
[facility] EMERGENCY DEPARTMENT Provider Note   CSN: [id number] Arrival date & time: 04/18/18  7829     History   Chief Complaint Chief Complaint  Patient presents with  . Vaginal Pain    HPI Catherine Mosley is a 32 y.o. female.  HPI Patient is a 32 year old female presents the emergency department with an abscess to her right labia is been present over the past several days without drainage.  No erythema.  No fevers or chills.  No history of abscess before in the past.  Pain is worse with palpation.   History reviewed. No pertinent past medical history.  There are no active problems to display for this patient.   History reviewed. No pertinent surgical history.   OB History    Gravida  1   Para      Term      Preterm      AB      Living        SAB      TAB      Ectopic      Multiple      Live Births               Home Medications    Prior to Admission medications   Medication Sig Start Date End Date Taking? Authorizing Provider  doxycycline (VIBRAMYCIN) 100 MG capsule Take 1 capsule (100 mg total) by mouth 2 (two) times daily. 04/18/18   Azalia Bilis, MD  ibuprofen (ADVIL,MOTRIN) 600 MG tablet Take 1 tablet (600 mg total) by mouth every 8 (eight) hours as needed. 04/18/18   Azalia Bilis, MD    Family History History reviewed. No pertinent family history.  Social History Social History   Tobacco Use  . Smoking status: Former Games developer  . Smokeless tobacco: Never Used  Substance Use Topics  . Alcohol use: Yes    Comment: occ  . Drug use: No     Allergies   Patient has no known allergies.   Review of Systems Review of Systems  All other systems reviewed and are negative.    Physical Exam Updated Vital Signs BP 105/69 (BP Location: Right Arm)   Pulse 63   Temp 98.6 F (37 C) (Oral)   Resp 18   LMP 04/15/2018   SpO2 99%   Physical Exam  Constitutional: She is oriented to person, place, and time. She appears  well-developed and well-nourished.  HENT:  Head: Normocephalic.  Eyes: EOM are normal.  Neck: Normal range of motion.  Pulmonary/Chest: Effort normal.  Abdominal: She exhibits no distension.  Genitourinary:  Genitourinary Comments: Fluctuant abscess without drainage or significant surrounding erythema of the right labia.  External abscess.  Nothing to said just Bartholin's gland cyst.  Chaperone present.  Musculoskeletal: Normal range of motion.  Neurological: She is alert and oriented to person, place, and time.  Psychiatric: She has a normal mood and affect.  Nursing note and vitals reviewed.    ED Treatments / Results  Labs (all labs ordered are listed, but only abnormal results are displayed) Labs Reviewed - No data to display  EKG None  Radiology No results found.  Procedures .Marland KitchenIncision and Drainage Performed by: Azalia Bilis, MD Authorized by: Azalia Bilis, MD     INCISION AND DRAINAGE Performed by: Azalia Bilis Consent: Verbal consent obtained. Risks and benefits: risks, benefits and alternatives were discussed Time out performed prior to procedure Type: abscess Body area: Right labia Anesthesia: local infiltration Incision was made  with a scalpel. Local anesthetic: lidocaine 2 % with epinephrine Anesthetic total: 3 ml Complexity: complex Blunt dissection to break up loculations Drainage: purulent Drainage amount: Moderate Packing material: None Patient tolerance: Patient tolerated the procedure well with no immediate complications.     Medications Ordered in ED Medications  lidocaine (XYLOCAINE) 1 % (with pres) injection (has no administration in time range)  ibuprofen (ADVIL,MOTRIN) tablet 600 mg (has no administration in time range)  doxycycline (VIBRA-TABS) tablet 100 mg (has no administration in time range)  lidocaine-EPINEPHrine (XYLOCAINE W/EPI) 2 %-1:200000 (PF) injection 20 mL (20 mLs Infiltration Given by Other 04/18/18 1135)      Initial Impression / Assessment and Plan / ED Course  I have reviewed the triage vital signs and the nursing notes.  Pertinent labs & imaging results that were available during my care of the patient were reviewed by me and considered in my medical decision making (see chart for details).     Patient tolerated the procedure well.  Home with anti-inflammatories and antibiotics.  Warm compresses and warm water soaks recommended.  Patient understands to return to the ER for new or worsening symptoms  Final Clinical Impressions(s) / ED Diagnoses   Final diagnoses:  Labial abscess    ED Discharge Orders         Ordered    ibuprofen (ADVIL,MOTRIN) 600 MG tablet  Every 8 hours PRN     04/18/18 1212    doxycycline (VIBRAMYCIN) 100 MG capsule  2 times daily     04/18/18 1212           Azalia Bilisampos, Hannelore Bova, MD 04/18/18 1214

## 2018-06-22 ENCOUNTER — Other Ambulatory Visit: Payer: Self-pay

## 2018-06-22 ENCOUNTER — Emergency Department (HOSPITAL_BASED_OUTPATIENT_CLINIC_OR_DEPARTMENT_OTHER)
Admission: EM | Admit: 2018-06-22 | Discharge: 2018-06-22 | Disposition: A | Discharge status: Home / Self Care | Payer: Self-pay | Attending: Emergency Medicine | Admitting: Emergency Medicine

## 2018-06-22 ENCOUNTER — Encounter (HOSPITAL_BASED_OUTPATIENT_CLINIC_OR_DEPARTMENT_OTHER): Payer: Self-pay | Admitting: *Deleted

## 2018-06-22 DIAGNOSIS — R350 Frequency of micturition: Secondary | ICD-10-CM | POA: Insufficient documentation

## 2018-06-22 DIAGNOSIS — Z87891 Personal history of nicotine dependence: Secondary | ICD-10-CM | POA: Insufficient documentation

## 2018-06-22 LAB — URINALYSIS, ROUTINE W REFLEX MICROSCOPIC
Bilirubin Urine: NEGATIVE
Glucose, UA: NEGATIVE mg/dL
Ketones, ur: 15 mg/dL — AB
NITRITE: NEGATIVE
Protein, ur: NEGATIVE mg/dL
pH: 6 (ref 5.0–8.0)

## 2018-06-22 LAB — CBC WITH DIFFERENTIAL/PLATELET
Abs Immature Granulocytes: 0.01 10*3/uL (ref 0.00–0.07)
Basophils Absolute: 0 10*3/uL (ref 0.0–0.1)
Basophils Relative: 1 %
Eosinophils Absolute: 0.1 10*3/uL (ref 0.0–0.5)
Eosinophils Relative: 1 %
HCT: 36.5 % (ref 36.0–46.0)
Hemoglobin: 11.8 g/dL — ABNORMAL LOW (ref 12.0–15.0)
Immature Granulocytes: 0 %
Lymphocytes Relative: 49 %
Lymphs Abs: 2.4 10*3/uL (ref 0.7–4.0)
MCH: 32.6 pg (ref 26.0–34.0)
MCHC: 32.3 g/dL (ref 30.0–36.0)
MCV: 100.8 fL — ABNORMAL HIGH (ref 80.0–100.0)
Monocytes Absolute: 0.3 10*3/uL (ref 0.1–1.0)
Monocytes Relative: 6 %
Neutro Abs: 2.1 10*3/uL (ref 1.7–7.7)
Neutrophils Relative %: 43 %
Platelets: 167 10*3/uL (ref 150–400)
RBC: 3.62 MIL/uL — ABNORMAL LOW (ref 3.87–5.11)
RDW: 13 % (ref 11.5–15.5)
Smear Review: NORMAL
WBC: 4.9 10*3/uL (ref 4.0–10.5)
nRBC: 0 % (ref 0.0–0.2)

## 2018-06-22 LAB — BASIC METABOLIC PANEL
Anion gap: 7 (ref 5–15)
BUN: 10 mg/dL (ref 6–20)
CO2: 23 mmol/L (ref 22–32)
Calcium: 8.8 mg/dL — ABNORMAL LOW (ref 8.9–10.3)
Chloride: 107 mmol/L (ref 98–111)
Creatinine, Ser: 0.65 mg/dL (ref 0.44–1.00)
GFR calc Af Amer: >60 mL/min (ref >60)
GFR calc non Af Amer: >60 mL/min (ref >60)
GLUCOSE: 87 mg/dL (ref 70–99)
POTASSIUM: 3.5 mmol/L (ref 3.5–5.1)
Sodium: 137 mmol/L (ref 135–145)

## 2018-06-22 LAB — URINALYSIS, MICROSCOPIC (REFLEX)

## 2018-06-22 LAB — PREGNANCY, URINE: PREG TEST UR: NEGATIVE

## 2018-06-22 MED ORDER — CEPHALEXIN 500 MG PO CAPS: 500.0000 mg | ORAL_CAPSULE | Freq: Four times a day (QID) | ORAL | 0 refills | Status: DC

## 2018-06-22 MED ORDER — CEPHALEXIN 500 MG PO CAPS: 500.0000 mg | ORAL_CAPSULE | Freq: Four times a day (QID) | ORAL | 0 refills | Status: AC

## 2018-06-22 NOTE — Discharge Instructions (Signed)
Take Keflex until completed.  You will be called if your urine culture grows a bacteria that this antibiotic will not treat.  Please follow-up with your OB/GYN if your symptoms are not resolving.  Please return to the emergency department if you develop any new or worsening symptoms.

## 2018-06-22 NOTE — ED Triage Notes (Signed)
Urinary frequency. Hx of pyelonephritis in the past. 2 weeks ago she was treated for a UTI.

## 2018-06-22 NOTE — ED Provider Notes (Signed)
[facility] EMERGENCY DEPARTMENT Provider Note   CSN: [id number] Arrival date & time: 06/22/18  1148     History   Chief Complaint Chief Complaint  Patient presents with  . Urinary Frequency    HPI Catherine Mosley is a 33 y.o. female who is previously healthy who presents with a 2-week history of urinary frequency.  Patient reports she was on Macrobid for 1 week starting on 06/03/2018, however her symptoms have not resolved and she is now started to have left back pain.  She is also had lack of appetite.  She denies any abdominal pain, nausea, vomiting, fevers, abnormal vaginal bleeding or discharge.  Patient was tested for STDs and was treated for trichomonas, but was negative for gonorrhea and chlamydia.  Patient has not been sexually active again since then.  Per chart review, her urine culture returned sensitive to Macrobid as well as all others.  Patient has not been taking any other medications for her symptoms.  HPI  History reviewed. No pertinent past medical history.  There are no active problems to display for this patient.   History reviewed. No pertinent surgical history.   OB History    Gravida  1   Para      Term      Preterm      AB      Living        SAB      TAB      Ectopic      Multiple      Live Births               Home Medications    Prior to Admission medications   Medication Sig Start Date End Date Taking? Authorizing Provider  cephALEXin (KEFLEX) 500 MG capsule Take 1 capsule (500 mg total) by mouth 4 (four) times daily for 14 days. 06/22/18 07/06/18  Emi Holes, PA-C  doxycycline (VIBRAMYCIN) 100 MG capsule Take 1 capsule (100 mg total) by mouth 2 (two) times daily. 04/18/18   Azalia Bilis, MD  ibuprofen (ADVIL,MOTRIN) 600 MG tablet Take 1 tablet (600 mg total) by mouth every 8 (eight) hours as needed. 04/18/18   Azalia Bilis, MD    Family History No family history on file.  Social History Social History    Tobacco Use  . Smoking status: Former Games developer  . Smokeless tobacco: Never Used  Substance Use Topics  . Alcohol use: Yes    Comment: occ  . Drug use: No     Allergies   Patient has no known allergies.   Review of Systems Review of Systems  Constitutional: Positive for appetite change. Negative for chills and fever.  HENT: Negative for facial swelling and sore throat.   Respiratory: Negative for shortness of breath.   Cardiovascular: Negative for chest pain.  Gastrointestinal: Negative for abdominal pain, nausea and vomiting.  Genitourinary: Positive for dysuria, flank pain and frequency. Negative for vaginal bleeding and vaginal discharge.  Musculoskeletal: Negative for back pain.  Skin: Negative for rash and wound.  Neurological: Negative for headaches.  Psychiatric/Behavioral: The patient is not nervous/anxious.      Physical Exam Updated Vital Signs BP 105/65 (BP Location: Left Arm)   Pulse (!) 56   Temp 98.1 F (36.7 C) (Oral)   Resp 18   Ht 5\' 4"  (1.626 m)   Wt 68.9 kg   LMP 06/13/2018   SpO2 100%   BMI 26.09 kg/m   Physical Exam Vitals signs and  nursing note reviewed.  Constitutional:      General: She is not in acute distress.    Appearance: She is well-developed. She is not diaphoretic.  HENT:     Head: Normocephalic and atraumatic.     Mouth/Throat:     Pharynx: No oropharyngeal exudate.  Eyes:     General: No scleral icterus.       Right eye: No discharge.        Left eye: No discharge.     Conjunctiva/sclera: Conjunctivae normal.     Pupils: Pupils are equal, round, and reactive to light.  Neck:     Musculoskeletal: Normal range of motion and neck supple.     Thyroid: No thyromegaly.  Cardiovascular:     Rate and Rhythm: Normal rate and regular rhythm.     Heart sounds: Normal heart sounds. No murmur. No friction rub. No gallop.   Pulmonary:     Effort: Pulmonary effort is normal. No respiratory distress.     Breath sounds: Normal breath  sounds. No stridor. No wheezing or rales.  Abdominal:     General: Bowel sounds are normal. There is no distension.     Palpations: Abdomen is soft.     Tenderness: There is no abdominal tenderness. There is no right CVA tenderness, left CVA tenderness, guarding or rebound.  Musculoskeletal:       Back:  Lymphadenopathy:     Cervical: No cervical adenopathy.  Skin:    General: Skin is warm and dry.     Coloration: Skin is not pale.     Findings: No rash.  Neurological:     Mental Status: She is alert.     Coordination: Coordination normal.      ED Treatments / Results  Labs (all labs ordered are listed, but only abnormal results are displayed) Labs Reviewed  URINALYSIS, ROUTINE W REFLEX MICROSCOPIC - Abnormal; Notable for the following components:      Result Value   APPearance CLOUDY (*)    Specific Gravity, Urine >1.030 (*)    Hgb urine dipstick SMALL (*)    Ketones, ur 15 (*)    Leukocytes, UA TRACE (*)    All other components within normal limits  URINALYSIS, MICROSCOPIC (REFLEX) - Abnormal; Notable for the following components:   Bacteria, UA MANY (*)    All other components within normal limits  CBC WITH DIFFERENTIAL/PLATELET - Abnormal; Notable for the following components:   RBC 3.62 (*)    Hemoglobin 11.8 (*)    MCV 100.8 (*)    All other components within normal limits  BASIC METABOLIC PANEL - Abnormal; Notable for the following components:   Calcium 8.8 (*)    All other components within normal limits  URINE CULTURE  PREGNANCY, URINE    EKG None  Radiology No results found.  Procedures Procedures (including critical care time)  Medications Ordered in ED Medications - No data to display   Initial Impression / Assessment and Plan / ED Course  I have reviewed the triage vital signs and the nursing notes.  Pertinent labs & imaging results that were available during my care of the patient were reviewed by me and considered in my medical decision  making (see chart for details).     Patient presenting with ongoing urinary frequency and now left flank pain after being treated for UTI, BV, trichomonas.   She denies any vaginal discharge, vaginal bleeding, or abdominal pain.  Patient tested negative for gonorrhea and chlamydia on  06/03/2018. Patient has not been sexually active since.  Her vaginal discharge has resolved. Her urinary frequency has continued and now she has some left-sided low back pain, but no CVA tenderness however will cover for pyelonephritis with Keflex.  UA does show many bacteria and trace leukocytes.  Urine culture sent.  CBC shows stable chronic anemia, 11.8.  BMP unremarkable.  Follow-up to OB/GYN if symptoms are not improving.  Return precautions discussed.  Patient understands and agrees with plan.  Patient vitals stable throughout ED course and discharged in satisfactory condition.  Final Clinical Impressions(s) / ED Diagnoses   Final diagnoses:  Urinary frequency    ED Discharge Orders         Ordered    cephALEXin (KEFLEX) 500 MG capsule  4 times daily     06/22/18 [address]           Tanyah Debruyne M, PA-C 06/22/18 1600    Rolan BuccoBelfi, Melanie, MD 06/26/18 1453

## 2018-06-24 LAB — URINE CULTURE

## 2019-01-04 ENCOUNTER — Other Ambulatory Visit: Payer: Self-pay

## 2019-01-04 ENCOUNTER — Emergency Department (HOSPITAL_BASED_OUTPATIENT_CLINIC_OR_DEPARTMENT_OTHER)
Admission: EM | Admit: 2019-01-04 | Discharge: 2019-01-04 | Disposition: A | Discharge status: Home / Self Care | Payer: Self-pay | Attending: Emergency Medicine | Admitting: Emergency Medicine

## 2019-01-04 ENCOUNTER — Encounter (HOSPITAL_BASED_OUTPATIENT_CLINIC_OR_DEPARTMENT_OTHER): Payer: Self-pay | Admitting: Emergency Medicine

## 2019-01-04 ENCOUNTER — Emergency Department (HOSPITAL_BASED_OUTPATIENT_CLINIC_OR_DEPARTMENT_OTHER): Payer: Self-pay

## 2019-01-04 DIAGNOSIS — A5901 Trichomonal vulvovaginitis: Secondary | ICD-10-CM

## 2019-01-04 DIAGNOSIS — A599 Trichomoniasis, unspecified: Secondary | ICD-10-CM | POA: Insufficient documentation

## 2019-01-04 DIAGNOSIS — Z3202 Encounter for pregnancy test, result negative: Secondary | ICD-10-CM | POA: Insufficient documentation

## 2019-01-04 DIAGNOSIS — N83209 Unspecified ovarian cyst, unspecified side: Secondary | ICD-10-CM | POA: Insufficient documentation

## 2019-01-04 DIAGNOSIS — Z87891 Personal history of nicotine dependence: Secondary | ICD-10-CM | POA: Insufficient documentation

## 2019-01-04 LAB — URINALYSIS, ROUTINE W REFLEX MICROSCOPIC
Bilirubin Urine: NEGATIVE
Glucose, UA: NEGATIVE mg/dL
Hgb urine dipstick: NEGATIVE
Ketones, ur: NEGATIVE mg/dL
Nitrite: NEGATIVE
Protein, ur: NEGATIVE mg/dL
Specific Gravity, Urine: >1.03 — ABNORMAL HIGH (ref 1.005–1.030)
pH: 6.5 (ref 5.0–8.0)

## 2019-01-04 LAB — CBC WITH DIFFERENTIAL/PLATELET
Abs Immature Granulocytes: 0.01 10*3/uL (ref 0.00–0.07)
Basophils Absolute: 0 10*3/uL (ref 0.0–0.1)
Basophils Relative: 1 %
Eosinophils Absolute: 0.1 10*3/uL (ref 0.0–0.5)
Eosinophils Relative: 3 %
HCT: 38.8 % (ref 36.0–46.0)
Hemoglobin: 12.4 g/dL (ref 12.0–15.0)
Immature Granulocytes: 0 %
Lymphocytes Relative: 46 %
Lymphs Abs: 2.5 10*3/uL (ref 0.7–4.0)
MCH: 32.5 pg (ref 26.0–34.0)
MCHC: 32 g/dL (ref 30.0–36.0)
MCV: 101.8 fL — ABNORMAL HIGH (ref 80.0–100.0)
Monocytes Absolute: 0.4 10*3/uL (ref 0.1–1.0)
Monocytes Relative: 7 %
Neutro Abs: 2.3 10*3/uL (ref 1.7–7.7)
Neutrophils Relative %: 43 %
Platelets: 158 10*3/uL (ref 150–400)
RBC: 3.81 MIL/uL — ABNORMAL LOW (ref 3.87–5.11)
RDW: 13 % (ref 11.5–15.5)
WBC: 5.3 10*3/uL (ref 4.0–10.5)
nRBC: 0 % (ref 0.0–0.2)

## 2019-01-04 LAB — URINALYSIS, MICROSCOPIC (REFLEX)

## 2019-01-04 LAB — BASIC METABOLIC PANEL
Anion gap: 9 (ref 5–15)
BUN: 15 mg/dL (ref 6–20)
CO2: 22 mmol/L (ref 22–32)
Calcium: 9.3 mg/dL (ref 8.9–10.3)
Chloride: 108 mmol/L (ref 98–111)
Creatinine, Ser: 0.71 mg/dL (ref 0.44–1.00)
GFR calc Af Amer: >60 mL/min (ref >60)
GFR calc non Af Amer: >60 mL/min (ref >60)
Glucose, Bld: 98 mg/dL (ref 70–99)
Potassium: 3.8 mmol/L (ref 3.5–5.1)
Sodium: 139 mmol/L (ref 135–145)

## 2019-01-04 LAB — WET PREP, GENITAL
Clue Cells Wet Prep HPF POC: NONE SEEN
Sperm: NONE SEEN
Yeast Wet Prep HPF POC: NONE SEEN

## 2019-01-04 LAB — PREGNANCY, URINE: Preg Test, Ur: NEGATIVE

## 2019-01-04 MED ORDER — ONDANSETRON 8 MG PO TBDP: ORAL_TABLET | ORAL | 0 refills | Status: DC

## 2019-01-04 MED ORDER — LIDOCAINE HCL (PF) 1 % IJ SOLN
INTRAMUSCULAR | Status: AC
  Administered 2019-01-04: 11:00:00 5 mL
  Filled 2019-01-04: qty 5

## 2019-01-04 MED ORDER — TRAMADOL HCL 50 MG PO TABS: 50.0000 mg | ORAL_TABLET | Freq: Once | ORAL | Status: DC

## 2019-01-04 MED ORDER — TRAMADOL HCL 50 MG PO TABS: 50.0000 mg | ORAL_TABLET | Freq: Four times a day (QID) | ORAL | 0 refills | Status: DC | PRN

## 2019-01-04 MED ORDER — ONDANSETRON 8 MG PO TBDP
8.0000 mg | ORAL_TABLET | Freq: Once | ORAL | Status: AC
  Administered 2019-01-04: 11:00:00 8 mg via ORAL
  Filled 2019-01-04: qty 1

## 2019-01-04 MED ORDER — CEFTRIAXONE SODIUM 250 MG IJ SOLR
250.0000 mg | Freq: Once | INTRAMUSCULAR | Status: AC
  Administered 2019-01-04: 10:00:00 250 mg via INTRAMUSCULAR
  Filled 2019-01-04: qty 250

## 2019-01-04 MED ORDER — METRONIDAZOLE 500 MG PO TABS: 500.0000 mg | ORAL_TABLET | Freq: Two times a day (BID) | ORAL | 0 refills | Status: DC

## 2019-01-04 MED ORDER — IBUPROFEN 800 MG PO TABS
800.0000 mg | ORAL_TABLET | Freq: Once | ORAL | Status: AC
  Administered 2019-01-04: 10:00:00 800 mg via ORAL
  Filled 2019-01-04: qty 1

## 2019-01-04 MED ORDER — AZITHROMYCIN 250 MG PO TABS
1000.0000 mg | ORAL_TABLET | Freq: Once | ORAL | Status: AC
  Administered 2019-01-04: 10:00:00 1000 mg via ORAL
  Filled 2019-01-04: qty 4

## 2019-01-04 MED FILL — traMADol HCL 50 MG TABS: 50 | 3 days supply | Qty: 15 | Fill #0

## 2019-01-04 MED FILL — METRONIDAZOLE 500 MG TABS: 500 | 7 days supply | Qty: 14 | Fill #0

## 2019-01-04 NOTE — ED Notes (Signed)
Patient transported to CT 

## 2019-01-04 NOTE — ED Notes (Signed)
C/o rt flank pain radiating to abd x 2 days w white dc and vag irritation

## 2019-01-04 NOTE — ED Notes (Signed)
Pt states she is feeling better .

## 2019-01-04 NOTE — ED Notes (Signed)
Pt started vomiting while at triage  md notified  Pt was given zofran 8 mg otd,  Will sit at triage to see if vomiting resolves

## 2019-01-04 NOTE — ED Provider Notes (Signed)
[facility] EMERGENCY DEPARTMENT Provider Note   CSN: [id number] Arrival date & time: 01/04/19  0827     History   Chief Complaint Chief Complaint  Patient presents with  . Flank Pain  . Abdominal Pain    HPI Catherine Mosley is a 33 y.o. female.     Patient is a 33 year old female presenting with complaints of a several day history of right-sided flank pain radiating into her right lower quadrant.  Pain has been constant and worsening during this period of time.  She denies any fevers or chills.  She denies any bowel or bladder complaints.  She reports having been diagnosed with a kidney infection several years ago and this feels similar.  The history is provided by the patient.  Flank Pain This is a new problem. The problem occurs constantly. The problem has been gradually worsening. Nothing relieves the symptoms. She has tried nothing for the symptoms.    No past medical history on file.  There are no active problems to display for this patient.   Past Surgical History:  Procedure Laterality Date  . TUBAL LIGATION       OB History    Gravida  1   Para      Term      Preterm      AB      Living        SAB      TAB      Ectopic      Multiple      Live Births               Home Medications    Prior to Admission medications   Not on File    Family History No family history on file.  Social History Social History   Tobacco Use  . Smoking status: Former Research scientist (life sciences)  . Smokeless tobacco: Never Used  Substance Use Topics  . Alcohol use: Yes    Comment: occ  . Drug use: No     Allergies   Patient has no known allergies.   Review of Systems Review of Systems  Genitourinary: Positive for flank pain.  All other systems reviewed and are negative.    Physical Exam Updated Vital Signs BP 121/76 (BP Location: Right Arm)   Pulse (!) 50   Temp 98.3 F (36.8 C) (Oral)   Resp 16   Ht 5\' 4"  (1.626 m)   Wt 60.8 kg   LMP  12/14/2018   SpO2 100%   BMI 23.00 kg/m   Physical Exam Vitals signs and nursing note reviewed.  Constitutional:      General: She is not in acute distress.    Appearance: She is well-developed. She is not diaphoretic.  HENT:     Head: Normocephalic and atraumatic.  Neck:     Musculoskeletal: Normal range of motion and neck supple.  Cardiovascular:     Rate and Rhythm: Normal rate and regular rhythm.     Heart sounds: No murmur. No friction rub. No gallop.   Pulmonary:     Effort: Pulmonary effort is normal. No respiratory distress.     Breath sounds: Normal breath sounds. No wheezing.  Abdominal:     General: Bowel sounds are normal. There is no distension.     Palpations: Abdomen is soft.     Tenderness: There is abdominal tenderness in the right upper quadrant and right lower quadrant. There is right CVA tenderness. There is no left CVA tenderness,  guarding or rebound.  Genitourinary:    Vagina: Vaginal discharge present.     Cervix: Discharge present.     Comments: There is a slight whitish discharge present.  There are no adnexal masses palpable.  There is no cervical motion tenderness. Musculoskeletal: Normal range of motion.  Skin:    General: Skin is warm and dry.  Neurological:     Mental Status: She is alert and oriented to person, place, and time.      ED Treatments / Results  Labs (all labs ordered are listed, but only abnormal results are displayed) Labs Reviewed  URINALYSIS, ROUTINE W REFLEX MICROSCOPIC - Abnormal; Notable for the following components:      Result Value   APPearance HAZY (*)    Specific Gravity, Urine >1.030 (*)    Leukocytes,Ua SMALL (*)    All other components within normal limits  CBC WITH DIFFERENTIAL/PLATELET - Abnormal; Notable for the following components:   RBC 3.81 (*)    MCV 101.8 (*)    All other components within normal limits  URINALYSIS, MICROSCOPIC (REFLEX) - Abnormal; Notable for the following components:   Bacteria,  UA FEW (*)    All other components within normal limits  WET PREP, GENITAL  PREGNANCY, URINE  BASIC METABOLIC PANEL  GC/CHLAMYDIA PROBE AMP ([place]) [facility]    EKG None  Radiology No results found.  Procedures Procedures (including critical care time)  Medications Ordered in ED Medications  traMADol (ULTRAM) tablet 50 mg (has no administration in time range)     Initial Impression / Assessment and Plan / ED Course  I have reviewed the triage vital signs and the nursing notes.  Pertinent labs & imaging results that were available during my care of the patient were reviewed by me and considered in my medical decision making (see chart for details).  Patient presenting here with complaints of right-sided flank pain that appears to be related to a ruptured ovarian cyst.  These findings were seen on CT scan.  This will be treated with pain medication and PRN return.  Wet prep also shows trichomonas.  This will be treated with Flagyl.  Patient also treated presumptively for GC and chlamydia with Rocephin and Zithromax.  Final Clinical Impressions(s) / ED Diagnoses   Final diagnoses:  None    ED Discharge Orders    None       Geoffery Lyonselo, Avin Gibbons, MD 01/04/19 1024

## 2019-01-04 NOTE — Discharge Instructions (Addendum)
Begin taking Flagyl as prescribed.  Tramadol as prescribed as needed for pain.  We will call you if your cultures indicate you require further treatment or action.  Return to the emergency department if you develop worsening pain, high fever, severe bleeding, or other new and concerning symptoms.

## 2019-01-04 NOTE — ED Triage Notes (Signed)
Right flank pain radiating to abdomen for 2 days.  No fever.  Some irritation with voiding.  White milky vaginal discharge.

## 2019-01-05 LAB — GC/CHLAMYDIA PROBE AMP (~~LOC~~) NOT AT ARMC
Chlamydia: NEGATIVE
Neisseria Gonorrhea: NEGATIVE

## 2019-03-07 ENCOUNTER — Emergency Department (HOSPITAL_BASED_OUTPATIENT_CLINIC_OR_DEPARTMENT_OTHER)
Admission: EM | Admit: 2019-03-07 | Discharge: 2019-03-07 | Disposition: A | Discharge status: Home / Self Care | Payer: Self-pay | Attending: Emergency Medicine | Admitting: Emergency Medicine

## 2019-03-07 ENCOUNTER — Encounter (HOSPITAL_BASED_OUTPATIENT_CLINIC_OR_DEPARTMENT_OTHER): Payer: Self-pay | Admitting: *Deleted

## 2019-03-07 ENCOUNTER — Other Ambulatory Visit: Payer: Self-pay

## 2019-03-07 DIAGNOSIS — Z87891 Personal history of nicotine dependence: Secondary | ICD-10-CM | POA: Insufficient documentation

## 2019-03-07 DIAGNOSIS — S61412A Laceration without foreign body of left hand, initial encounter: Secondary | ICD-10-CM | POA: Insufficient documentation

## 2019-03-07 DIAGNOSIS — W260XXA Contact with knife, initial encounter: Secondary | ICD-10-CM | POA: Insufficient documentation

## 2019-03-07 DIAGNOSIS — Y9389 Activity, other specified: Secondary | ICD-10-CM | POA: Insufficient documentation

## 2019-03-07 DIAGNOSIS — Z79899 Other long term (current) drug therapy: Secondary | ICD-10-CM | POA: Insufficient documentation

## 2019-03-07 DIAGNOSIS — Y999 Unspecified external cause status: Secondary | ICD-10-CM | POA: Insufficient documentation

## 2019-03-07 DIAGNOSIS — Y9289 Other specified places as the place of occurrence of the external cause: Secondary | ICD-10-CM | POA: Insufficient documentation

## 2019-03-07 MED ORDER — IBUPROFEN 400 MG PO TABS
600.0000 mg | ORAL_TABLET | Freq: Once | ORAL | Status: AC
  Administered 2019-03-07: 19:00:00 600 mg via ORAL
  Filled 2019-03-07: qty 1

## 2019-03-07 MED ORDER — BACITRACIN ZINC 500 UNIT/GM EX OINT: TOPICAL_OINTMENT | Freq: Once | CUTANEOUS | Status: DC

## 2019-03-07 MED ORDER — LIDOCAINE-EPINEPHRINE (PF) 2 %-1:200000 IJ SOLN
10.0000 mL | Freq: Once | INTRAMUSCULAR | Status: AC
  Administered 2019-03-07: 10 mL
  Filled 2019-03-07 (×2): qty 10

## 2019-03-07 NOTE — ED Triage Notes (Signed)
Lac to left hand from knife. Pt states she was attempting to open a bottle with the knife and it slipped. Bleeding controlled

## 2019-03-07 NOTE — ED Notes (Signed)
ED Provider at bedside for sutures. 

## 2019-03-07 NOTE — ED Provider Notes (Signed)
[facility] EMERGENCY DEPARTMENT Provider Note   CSN: [id number] Arrival date & time: 03/07/19  1900     History   Chief Complaint Chief Complaint  Patient presents with  . Laceration    HPI Catherine Mosley is a 33 y.o. female.     HPI  33 year old female presents with laceration to the left hand.  She was trying to open a bottle and could not so she tried to use a knife and then the knife accidentally sliced across her left MCP on the second digit.  Bleeding controlled.  No weakness or numbness.  No pain besides pain at the laceration site.  Last tetanus was 2013.  History reviewed. No pertinent past medical history.  There are no active problems to display for this patient.   Past Surgical History:  Procedure Laterality Date  . TUBAL LIGATION       OB History    Gravida  1   Para      Term      Preterm      AB      Living        SAB      TAB      Ectopic      Multiple      Live Births               Home Medications    Prior to Admission medications   Medication Sig Start Date End Date Taking? Authorizing Provider  metroNIDAZOLE (FLAGYL) 500 MG tablet Take 1 tablet (500 mg total) by mouth 2 (two) times daily. One po bid x 7 days 01/04/19   Veryl Speak, MD  ondansetron (ZOFRAN ODT) 8 MG disintegrating tablet 8mg  ODT q4 hours prn nausea 01/04/19   Veryl Speak, MD  traMADol (ULTRAM) 50 MG tablet Take 1 tablet (50 mg total) by mouth every 6 (six) hours as needed. 01/04/19   Veryl Speak, MD    Family History No family history on file.  Social History Social History   Tobacco Use  . Smoking status: Former Research scientist (life sciences)  . Smokeless tobacco: Never Used  Substance Use Topics  . Alcohol use: Yes    Comment: occ  . Drug use: No     Allergies   Patient has no known allergies.   Review of Systems Review of Systems  Skin: Positive for wound.  Neurological: Negative for weakness and numbness.     Physical Exam Updated Vital  Signs BP 120/67 (BP Location: Right Arm)   Pulse 70   Temp 98.2 F (36.8 C) (Oral)   Resp 18   Ht 5\' 4"  (1.626 m)   Wt 59.9 kg   LMP 02/13/2019   SpO2 100%   Breastfeeding No   BMI 22.66 kg/m   Physical Exam Vitals signs and nursing note reviewed.  Constitutional:      General: She is not in acute distress.    Appearance: Normal appearance. She is well-developed. She is not ill-appearing or diaphoretic.  HENT:     Head: Normocephalic and atraumatic.  Eyes:     General:        Right eye: No discharge.        Left eye: No discharge.  Cardiovascular:     Rate and Rhythm: Normal rate and regular rhythm.     Pulses:          Radial pulses are 2+ on the left side.  Pulmonary:     Effort: Pulmonary effort is normal.  Abdominal:     General: There is no distension.  Musculoskeletal:     Left hand: She exhibits laceration. She exhibits no tenderness and no deformity. Normal sensation noted. Normal strength noted.       Hands:     Comments: Full ROM of 2nd digit and hand. Normal gross sensation.  Skin:    General: Skin is warm and dry.  Neurological:     Mental Status: She is alert.  Psychiatric:        Mood and Affect: Mood is not anxious.      ED Treatments / Results  Labs (all labs ordered are listed, but only abnormal results are displayed) Labs Reviewed - No data to display  EKG None  Radiology No results found.  Procedures .Marland KitchenLaceration Repair  Date/Time: 03/07/2019 7:33 PM Performed by: Pricilla Loveless, MD Authorized by: Pricilla Loveless, MD   Consent:    Consent obtained:  Verbal   Consent given by:  Patient Laceration details:    Location:  Hand   Hand location:  L hand, dorsum   Length (cm):  2 Repair type:    Repair type:  Simple Pre-procedure details:    Preparation:  Patient was prepped and draped in usual sterile fashion Exploration:    Hemostasis achieved with:  Direct pressure   Contaminated: no   Treatment:    Amount of cleaning:   Standard   Irrigation solution:  Sterile water Skin repair:    Repair method:  Sutures   Suture size:  4-0   Suture material:  Prolene   Suture technique:  Simple interrupted   Number of sutures:  3 Approximation:    Approximation:  Close Post-procedure details:    Dressing:  Antibiotic ointment   Patient tolerance of procedure:  Tolerated well, no immediate complications   (including critical care time)  Medications Ordered in ED Medications  bacitracin ointment (has no administration in time range)  lidocaine-EPINEPHrine (XYLOCAINE W/EPI) 2 %-1:200000 (PF) injection 10 mL (10 mLs Infiltration Given 03/07/19 1920)  ibuprofen (ADVIL) tablet 600 mg (600 mg Oral Given 03/07/19 1926)     Initial Impression / Assessment and Plan / ED Course  I have reviewed the triage vital signs and the nursing notes.  Pertinent labs & imaging results that were available during my care of the patient were reviewed by me and considered in my medical decision making (see chart for details).        Patient with superficial hand laceration near the MCP.  It appears shallow and given no surrounding bony tenderness with a clean cut by non-dirty knife, I do not think imaging is needed given very low suspicion for bony injury.  Tetanus is up-to-date.  Laceration cleaned and closed as above.  Discharged home with return precautions.  Final Clinical Impressions(s) / ED Diagnoses   Final diagnoses:  Laceration of left hand without foreign body, initial encounter    ED Discharge Orders    None       Pricilla Loveless, MD 03/07/19 1939

## 2019-03-21 ENCOUNTER — Encounter (HOSPITAL_BASED_OUTPATIENT_CLINIC_OR_DEPARTMENT_OTHER): Payer: Self-pay | Admitting: Emergency Medicine

## 2019-03-21 ENCOUNTER — Other Ambulatory Visit: Payer: Self-pay

## 2019-03-21 ENCOUNTER — Emergency Department (HOSPITAL_BASED_OUTPATIENT_CLINIC_OR_DEPARTMENT_OTHER)
Admission: EM | Admit: 2019-03-21 | Discharge: 2019-03-21 | Disposition: A | Discharge status: Home / Self Care | Payer: Self-pay | Attending: Emergency Medicine | Admitting: Emergency Medicine

## 2019-03-21 DIAGNOSIS — Z20822 Contact with and (suspected) exposure to covid-19: Secondary | ICD-10-CM

## 2019-03-21 DIAGNOSIS — S61411D Laceration without foreign body of right hand, subsequent encounter: Secondary | ICD-10-CM | POA: Insufficient documentation

## 2019-03-21 DIAGNOSIS — U071 COVID-19: Secondary | ICD-10-CM | POA: Insufficient documentation

## 2019-03-21 DIAGNOSIS — X58XXXD Exposure to other specified factors, subsequent encounter: Secondary | ICD-10-CM | POA: Insufficient documentation

## 2019-03-21 DIAGNOSIS — Z87891 Personal history of nicotine dependence: Secondary | ICD-10-CM | POA: Insufficient documentation

## 2019-03-21 DIAGNOSIS — J069 Acute upper respiratory infection, unspecified: Secondary | ICD-10-CM

## 2019-03-21 LAB — SARS CORONAVIRUS 2 (TAT 6-24 HRS): SARS Coronavirus 2: POSITIVE — AB

## 2019-03-21 NOTE — Discharge Instructions (Signed)
Person Under Monitoring Name: Catherine Mosley  Location: [address] [place] [address]   Infection Prevention Recommendations for Individuals Confirmed to have, or Being Evaluated for, 2019 Novel Coronavirus (COVID-19) Infection Who Receive Care at Home  Individuals who are confirmed to have, or are being evaluated for, COVID-19 should follow the prevention steps below until a healthcare provider or local or state health department says they can return to normal activities.  Stay home except to get medical care You should restrict activities outside your home, except for getting medical care. Do not go to work, school, or public areas, and do not use public transportation or taxis.  Call ahead before visiting your doctor Before your medical appointment, call the healthcare provider and tell them that you have, or are being evaluated for, COVID-19 infection. This will help the healthcare providers office take steps to keep other people from getting infected. Ask your healthcare provider to call the local or state health department.  Monitor your symptoms Seek prompt medical attention if your illness is worsening (e.g., difficulty breathing). Before going to your medical appointment, call the healthcare provider and tell them that you have, or are being evaluated for, COVID-19 infection. Ask your healthcare provider to call the local or state health department.  Wear a facemask You should wear a facemask that covers your nose and mouth when you are in the same room with other people and when you visit a healthcare provider. People who live with or visit you should also wear a facemask while they are in the same room with you.  Separate yourself from other people in your home As much as possible, you should stay in a different room from other people in your home. Also, you should use a separate bathroom, if available.  Avoid sharing household items You should not share  dishes, drinking glasses, cups, eating utensils, towels, bedding, or other items with other people in your home. After using these items, you should wash them thoroughly with soap and water.  Cover your coughs and sneezes Cover your mouth and nose with a tissue when you cough or sneeze, or you can cough or sneeze into your sleeve. Throw used tissues in a lined trash can, and immediately wash your hands with soap and water for at least 20 seconds or use an alcohol-based hand rub.  Wash your [facility] your hands often and thoroughly with soap and water for at least 20 seconds. You can use an alcohol-based hand sanitizer if soap and water are not available and if your hands are not visibly dirty. Avoid touching your eyes, nose, and mouth with unwashed hands.   Prevention Steps for Caregivers and Household Members of Individuals Confirmed to have, or Being Evaluated for, COVID-19 Infection Being Cared for in the Home  If you live with, or provide care at home for, a person confirmed to have, or being evaluated for, COVID-19 infection please follow these guidelines to prevent infection:  Follow healthcare providers instructions Make sure that you understand and can help the patient follow any healthcare provider instructions for all care.  Provide for the patients basic needs You should help the patient with basic needs in the home and provide support for getting groceries, prescriptions, and other personal needs.  Monitor the patients symptoms If they are getting sicker, call his or her medical provider and tell them that the patient has, or is being evaluated for, COVID-19 infection. This will help the healthcare providers office  take steps to keep other people from getting infected. °Ask the healthcare provider to call the local or state health department. ° °Limit the number of people who have contact with the patient °If possible, have only one caregiver for the patient. °Other  household members should stay in another home or place of residence. If this is not possible, they should stay °in another room, or be separated from the patient as much as possible. Use a separate bathroom, if available. °Restrict visitors who do not have an essential need to be in the home. ° °Keep older adults, very young children, and other sick people away from the patient °Keep older adults, very young children, and those who have compromised immune systems or chronic health conditions away from the patient. This includes people with chronic heart, lung, or kidney conditions, diabetes, and cancer. ° °Ensure good ventilation °Make sure that shared spaces in the home have good air flow, such as from an air conditioner or an opened window, °weather permitting. ° °Wash your hands often °Wash your hands often and thoroughly with soap and water for at least 20 seconds. You can use an alcohol based hand sanitizer if soap and water are not available and if your hands are not visibly dirty. °Avoid touching your eyes, nose, and mouth with unwashed hands. °Use disposable paper towels to dry your hands. If not available, use dedicated cloth towels and replace them when they become wet. ° °Wear a facemask and gloves °Wear a disposable facemask at all times in the room and gloves when you touch or have contact with the patient’s blood, body fluids, and/or secretions or excretions, such as sweat, saliva, sputum, nasal mucus, vomit, urine, or feces.  Ensure the mask fits over your nose and mouth tightly, and do not touch it during use. °Throw out disposable facemasks and gloves after using them. Do not reuse. °Wash your hands immediately after removing your facemask and gloves. °If your personal clothing becomes contaminated, carefully remove clothing and launder. Wash your hands after handling contaminated clothing. °Place all used disposable facemasks, gloves, and other waste in a lined container before disposing them with  other household waste. °Remove gloves and wash your hands immediately after handling these items. ° °Do not share dishes, glasses, or other household items with the patient °Avoid sharing household items. You should not share dishes, drinking glasses, cups, eating utensils, towels, bedding, or other items with a patient who is confirmed to have, or being evaluated for, COVID-19 infection. °After the person uses these items, you should wash them thoroughly with soap and water. ° °Wash laundry thoroughly °Immediately remove and wash clothes or bedding that have blood, body fluids, and/or secretions or excretions, such as sweat, saliva, sputum, nasal mucus, vomit, urine, or feces, on them. °Wear gloves when handling laundry from the patient. °Read and follow directions on labels of laundry or clothing items and detergent. In general, wash and dry with the warmest temperatures recommended on the label. ° °Clean all areas the individual has used often °Clean all touchable surfaces, such as counters, tabletops, doorknobs, bathroom fixtures, toilets, phones, keyboards, tablets, and bedside tables, every day. Also, clean any surfaces that may have blood, body fluids, and/or secretions or excretions on them. °Wear gloves when cleaning surfaces the patient has come in contact with. °Use a diluted bleach solution (e.g., dilute bleach with 1 part bleach and 10 parts water) or a household disinfectant with a label that says EPA-registered for coronaviruses. To make a bleach   solution at home, add 1 tablespoon of bleach to 1 quart (4 cups) of water. For a larger supply, add  cup of bleach to 1 gallon (16 cups) of water. Read labels of cleaning products and follow recommendations provided on product labels. Labels contain instructions for safe and effective use of the cleaning product including precautions you should take when applying the product, such as wearing gloves or eye protection and making sure you have good ventilation  during use of the product. Remove gloves and wash hands immediately after cleaning.  Monitor yourself for signs and symptoms of illness Caregivers and household members are considered close contacts, should monitor their health, and will be asked to limit movement outside of the home to the extent possible. Follow the monitoring steps for close contacts listed on the symptom monitoring form.   ? If you have additional questions, contact your local health department or call the epidemiologist on call at [phone] (available 24/7). ? This guidance is subject to change. For the most up-to-date guidance from [facility], please refer to their website: YouBlogs.pl

## 2019-03-21 NOTE — ED Notes (Signed)
Pt verbalized understanding of dc instructions.

## 2019-03-21 NOTE — ED Triage Notes (Signed)
C/o HA, nasal congestion, loss of taste/smell, "body soreness" that started 2 days ago. Denies fever, cough and SHOB.

## 2019-03-21 NOTE — ED Provider Notes (Addendum)
[facility] EMERGENCY DEPARTMENT Provider Note   CSN: [id number] Arrival date & time: 03/21/19  7353     History   Chief Complaint Chief Complaint  Patient presents with  . viral type illness    HPI Catherine Mosley is a 33 y.o. female.     HPI Symptoms onset 3 days ago.  Patient reports generalized headache, nasal congestion and drainage, body aches and fatigue.  She reports loss of smell and taste.  No fever, no cough, no shortness of breath, no abdominal pain or vomiting.  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  No known Covid contact.  She is otherwise healthy.  She has a second presentation requesting removal of stitches from her hand.  No complications of that. History reviewed. No pertinent past medical history.  There are no active problems to display for this patient.   Past Surgical History:  Procedure Laterality Date  . TUBAL LIGATION       OB History    Gravida  1   Para      Term      Preterm      AB      Living        SAB      TAB      Ectopic      Multiple      Live Births               Home Medications    Prior to Admission medications   Medication Sig Start Date End Date Taking? Authorizing Provider  metroNIDAZOLE (FLAGYL) 500 MG tablet Take 1 tablet (500 mg total) by mouth 2 (two) times daily. One po bid x 7 days 01/04/19   Geoffery Lyons, MD  ondansetron (ZOFRAN ODT) 8 MG disintegrating tablet 8mg  ODT q4 hours prn nausea 01/04/19   03/06/19, MD  traMADol (ULTRAM) 50 MG tablet Take 1 tablet (50 mg total) by mouth every 6 (six) hours as needed. 01/04/19   03/06/19, MD    Family History No family history on file.  Social History Social History   Tobacco Use  . Smoking status: Former Geoffery Lyons  . Smokeless tobacco: Never Used  Substance Use Topics  . Alcohol use: Yes    Comment: occ  . Drug use: No     Allergies   Patient has no known allergies.   Review of Systems Review  of Systems 10 Systems reviewed and are negative for acute change except as noted in the HPI.   Physical Exam Updated Vital Signs BP 117/82 (BP Location: Right Arm)   Pulse 75   Temp 98.6 F (37 C) (Oral)   Resp 20   Ht 5\' 4"  (1.626 m)   Wt 59.9 kg   LMP 03/18/2019   SpO2 100%   BMI 22.66 kg/m   Physical Exam Constitutional:      Appearance: She is well-developed.  HENT:     Head: Normocephalic and atraumatic.     Right Ear: Tympanic membrane normal.     Left Ear: Tympanic membrane normal.     Mouth/Throat:     Mouth: Mucous membranes are moist.     Pharynx: Oropharynx is clear. No oropharyngeal exudate or posterior oropharyngeal erythema.  Eyes:     Pupils: Pupils are equal, round, and reactive to light.  Neck:     Musculoskeletal: Neck supple.  Cardiovascular:     Rate and Rhythm: Normal rate and regular rhythm.  Heart sounds: Normal heart sounds.  Pulmonary:     Effort: Pulmonary effort is normal.     Breath sounds: Normal breath sounds.  Abdominal:     General: Bowel sounds are normal. There is no distension.     Palpations: Abdomen is soft.     Tenderness: There is no abdominal tenderness.  Musculoskeletal: Normal range of motion.     Comments: 2 sutures overlying the right second metacarpal.  Completely healed.  No erythema no swelling normal range of motion of the joint.  Skin:    General: Skin is warm and dry.  Neurological:     Mental Status: She is alert and oriented to person, place, and time.     GCS: GCS eye subscore is 4. GCS verbal subscore is 5. GCS motor subscore is 6.     Coordination: Coordination normal.      ED Treatments / Results  Labs (all labs ordered are listed, but only abnormal results are displayed) Labs Reviewed  SARS CORONAVIRUS 2 (TAT 6-24 HRS)    EKG None  Radiology No results found.  Procedures .Suture Removal  Date/Time: 03/21/2019 7:25 AM Performed by: Charlesetta Shanks, MD Authorized by: Charlesetta Shanks, MD    Consent:    Consent obtained:  Verbal   Consent given by:  Patient   Risks discussed:  Bleeding and pain Location:    Location:  Upper extremity   Upper extremity location:  Hand   Hand location:  R hand Procedure details:    Wound appearance:  No signs of infection, good wound healing and clean   Number of sutures removed:  2 Post-procedure details:    Post-removal:  No dressing applied   Patient tolerance of procedure:  Tolerated well, no immediate complications   (including critical care time)  Medications Ordered in ED Medications - No data to display   Initial Impression / Assessment and Plan / ED Course  I have reviewed the triage vital signs and the nursing notes.  Pertinent labs & imaging results that were available during my care of the patient were reviewed by me and considered in my medical decision making (see chart for details).       Patient clinically well in appearance.  Normal throat exam.  No fever, no cough.  Symptoms otherwise highly suggestive of COVID-19.  Testing obtained.  Precautions given.  Secondarily patient requested removal of sutures from the right hand.  Uncomplicated.  Completely healed.  Catherine Mosley was evaluated in Emergency Department on 03/21/2019 for the symptoms described in the history of present illness. She was evaluated in the context of the global COVID-19 pandemic, which necessitated consideration that the patient might be at risk for infection with the SARS-CoV-2 virus that causes COVID-19. Institutional protocols and algorithms that pertain to the evaluation of patients at risk for COVID-19 are in a state of rapid change based on information released by regulatory bodies including the CDC and federal and state organizations. These policies and algorithms were followed during the patient's care in the ED. Final Clinical Impressions(s) / ED Diagnoses   Final diagnoses:  Viral upper respiratory illness  Suspected COVID-19 virus  infection    ED Discharge Orders    None       Charlesetta Shanks, MD 03/21/19 [id number]    Charlesetta Shanks, MD 03/21/19 [phone]

## 2019-03-22 ENCOUNTER — Telehealth: Payer: Self-pay

## 2019-03-22 NOTE — Telephone Encounter (Signed)
Pt notified of positive COVID-19 test results. Pt verbalized understanding. Pt reports that she has some body aches.Pt advised to remain in self quarantine until at least 10 days since symptom onset And 3 consecutive days fever free without antipyretics And improvement in respiratory symptoms. Patient advised to utilize over the counter medications to treat symptoms. Pt advised to seek treatment in the ED if respiratory issues/distress develops.Pt advised they should only leave home to seek and medical care and must wear a mask in public. Pt instructed to limit contact with family members or caregivers in the home. Pt advised to practice social distancing and to continue to use good preventative care measures such has frequent hand washing, staying out of crowds and cleaning hard surfaces frequently touched in the home.Pt informed that the health department will likely follow up and may have additional recommendations. Will notify [facility] Department.

## 2019-12-30 ENCOUNTER — Emergency Department (HOSPITAL_BASED_OUTPATIENT_CLINIC_OR_DEPARTMENT_OTHER): Payer: Self-pay

## 2019-12-30 ENCOUNTER — Other Ambulatory Visit: Payer: Self-pay

## 2019-12-30 ENCOUNTER — Emergency Department (HOSPITAL_BASED_OUTPATIENT_CLINIC_OR_DEPARTMENT_OTHER)
Admission: EM | Admit: 2019-12-30 | Discharge: 2019-12-30 | Disposition: A | Discharge status: Home / Self Care | Payer: Self-pay | Attending: Emergency Medicine | Admitting: Emergency Medicine

## 2019-12-30 ENCOUNTER — Encounter (HOSPITAL_BASED_OUTPATIENT_CLINIC_OR_DEPARTMENT_OTHER): Payer: Self-pay

## 2019-12-30 DIAGNOSIS — Y92410 Unspecified street and highway as the place of occurrence of the external cause: Secondary | ICD-10-CM | POA: Insufficient documentation

## 2019-12-30 DIAGNOSIS — Z87891 Personal history of nicotine dependence: Secondary | ICD-10-CM | POA: Insufficient documentation

## 2019-12-30 DIAGNOSIS — S128XXA Fracture of other parts of neck, initial encounter: Secondary | ICD-10-CM

## 2019-12-30 DIAGNOSIS — S12200A Unspecified displaced fracture of third cervical vertebra, initial encounter for closed fracture: Secondary | ICD-10-CM | POA: Insufficient documentation

## 2019-12-30 DIAGNOSIS — Y999 Unspecified external cause status: Secondary | ICD-10-CM | POA: Insufficient documentation

## 2019-12-30 DIAGNOSIS — Y9389 Activity, other specified: Secondary | ICD-10-CM | POA: Insufficient documentation

## 2019-12-30 MED ORDER — METHOCARBAMOL 500 MG PO TABS: 500.0000 mg | ORAL_TABLET | Freq: Two times a day (BID) | ORAL | 0 refills | Status: DC

## 2019-12-30 MED ORDER — METHOCARBAMOL 500 MG PO TABS
500.0000 mg | ORAL_TABLET | Freq: Once | ORAL | Status: AC
  Administered 2019-12-30: 500 mg via ORAL
  Filled 2019-12-30: qty 1

## 2019-12-30 MED ORDER — NAPROXEN 500 MG PO TABS: 500.0000 mg | ORAL_TABLET | Freq: Two times a day (BID) | ORAL | 0 refills | Status: DC

## 2019-12-30 MED ORDER — ACETAMINOPHEN 500 MG PO TABS
1000.0000 mg | ORAL_TABLET | Freq: Once | ORAL | Status: AC
  Administered 2019-12-30: 1000 mg via ORAL
  Filled 2019-12-30: qty 2

## 2019-12-30 MED ORDER — HYDROCODONE-ACETAMINOPHEN 5-325 MG PO TABS: 1.0000 | ORAL_TABLET | Freq: Four times a day (QID) | ORAL | 0 refills | Status: DC | PRN
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
## 2019-12-30 NOTE — Discharge Instructions (Addendum)
Follow-up with neurosurgery for your possible fracture.  You may take anti-inflammatories and pain medication as prescribed.
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
## 2019-12-30 NOTE — ED Notes (Signed)
c-collar applied  

## 2020-01-28 ENCOUNTER — Encounter (HOSPITAL_BASED_OUTPATIENT_CLINIC_OR_DEPARTMENT_OTHER): Payer: Self-pay | Admitting: Emergency Medicine

## 2020-01-28 ENCOUNTER — Emergency Department (HOSPITAL_BASED_OUTPATIENT_CLINIC_OR_DEPARTMENT_OTHER)
Admission: EM | Admit: 2020-01-28 | Discharge: 2020-01-28 | Disposition: A | Discharge status: Home / Self Care | Payer: Self-pay | Attending: Emergency Medicine | Admitting: Emergency Medicine

## 2020-01-28 ENCOUNTER — Other Ambulatory Visit: Payer: Self-pay

## 2020-01-28 DIAGNOSIS — M545 Low back pain, unspecified: Secondary | ICD-10-CM

## 2020-01-28 DIAGNOSIS — R350 Frequency of micturition: Secondary | ICD-10-CM | POA: Insufficient documentation

## 2020-01-28 DIAGNOSIS — N3000 Acute cystitis without hematuria: Secondary | ICD-10-CM

## 2020-01-28 DIAGNOSIS — F17290 Nicotine dependence, other tobacco product, uncomplicated: Secondary | ICD-10-CM | POA: Insufficient documentation

## 2020-01-28 DIAGNOSIS — Z79899 Other long term (current) drug therapy: Secondary | ICD-10-CM | POA: Insufficient documentation

## 2020-01-28 LAB — URINALYSIS, MICROSCOPIC (REFLEX)

## 2020-01-28 LAB — URINALYSIS, ROUTINE W REFLEX MICROSCOPIC
Bilirubin Urine: NEGATIVE
Glucose, UA: NEGATIVE mg/dL
Ketones, ur: NEGATIVE mg/dL
Nitrite: POSITIVE — AB
Protein, ur: NEGATIVE mg/dL
Specific Gravity, Urine: >1.03 — ABNORMAL HIGH (ref 1.005–1.030)
pH: 6 (ref 5.0–8.0)

## 2020-01-28 LAB — PREGNANCY, URINE: Preg Test, Ur: NEGATIVE

## 2020-01-28 MED ORDER — CEPHALEXIN 500 MG PO CAPS: 500.0000 mg | ORAL_CAPSULE | Freq: Three times a day (TID) | ORAL | 0 refills | Status: AC

## 2020-01-28 MED ORDER — KETOROLAC TROMETHAMINE 60 MG/2ML IM SOLN
30.0000 mg | Freq: Once | INTRAMUSCULAR | Status: AC
  Administered 2020-01-28: 30 mg via INTRAMUSCULAR
  Filled 2020-01-28: qty 2

## 2020-01-28 MED ORDER — METHOCARBAMOL 500 MG PO TABS: 500.0000 mg | ORAL_TABLET | Freq: Two times a day (BID) | ORAL | 0 refills | Status: DC

## 2020-01-28 MED ORDER — NAPROXEN 500 MG PO TABS: 500.0000 mg | ORAL_TABLET | Freq: Two times a day (BID) | ORAL | 0 refills | Status: DC

## 2020-01-28 MED FILL — CEPHALEXIN 500 MG CAPSULE: 500 | 7 days supply | Qty: 21 | Fill #0

## 2020-01-28 MED FILL — METHOCARBAMOL 500 MG TABS: 500 | 5 days supply | Qty: 10 | Fill #0

## 2020-01-28 MED FILL — NAPROXEN 500 MG TABS: 500 | 15 days supply | Qty: 30 | Fill #0

## 2020-01-28 NOTE — ED Triage Notes (Signed)
Pt is c/o lower back pain on the right side  Pt states pain started yesterday and got worse overnight  Pt describes the pain as a sharp stabbing pain   Pt is c/o nausea

## 2020-01-28 NOTE — ED Provider Notes (Signed)
[facility] EMERGENCY DEPARTMENT Provider Note  CSN: [id number] Arrival date & time: 01/28/20 7824    History Chief Complaint  Patient presents with  . Back Pain    HPI  Catherine Mosley is a 34 y.o. female with no significant PMH reports she was doing some heavy lifting at work yesterday and felt a sharp pain in her R lower back. She continued working, but when she was getting up for work today she was in more pain and increasing stiffness. Her pain is moderate to severe and worse with bending/twisting. No bowel or bladder incontinence. No fever. She is not pregnant. She reports some increased urinary frequency but no dysuria. Pain does not radiate.    History reviewed. No pertinent past medical history.  Past Surgical History:  Procedure Laterality Date  . TUBAL LIGATION      History reviewed. No pertinent family history.  Social History   Tobacco Use  . Smoking status: Current Some Day Smoker    Types: Cigars  . Smokeless tobacco: Never Used  Vaping Use  . Vaping Use: Never used  Substance Use Topics  . Alcohol use: Not Currently    Comment: occ  . Drug use: No     Home Medications Prior to Admission medications   Medication Sig Start Date End Date Taking? Authorizing Provider  HYDROcodone-acetaminophen (NORCO/VICODIN) 5-325 MG tablet Take 1 tablet by mouth every 6 (six) hours as needed. 12/30/19   Henderly, Britni A, PA-C  methocarbamol (ROBAXIN) 500 MG tablet Take 1 tablet (500 mg total) by mouth 2 (two) times daily. 12/30/19   Henderly, Britni A, PA-C  metroNIDAZOLE (FLAGYL) 500 MG tablet Take 1 tablet (500 mg total) by mouth 2 (two) times daily. One po bid x 7 days 01/04/19   Geoffery Lyons, MD  naproxen (NAPROSYN) 500 MG tablet Take 1 tablet (500 mg total) by mouth 2 (two) times daily. 12/30/19   Henderly, Britni A, PA-C  ondansetron (ZOFRAN ODT) 8 MG disintegrating tablet 8mg  ODT q4 hours prn nausea 01/04/19   03/06/19, MD  traMADol (ULTRAM) 50 MG  tablet Take 1 tablet (50 mg total) by mouth every 6 (six) hours as needed. 01/04/19   03/06/19, MD     Allergies    Patient has no known allergies.   Review of Systems   Review of Systems A comprehensive review of systems was completed and negative except as noted in HPI.    Physical Exam BP 111/75 (BP Location: Left Arm)   Pulse (!) 58   Temp 97.9 F (36.6 C) (Oral)   Resp 18   Ht 5\' 3"  (1.6 m)   Wt 64.4 kg   LMP 01/18/2020 (Exact Date)   SpO2 100%   BMI 25.15 kg/m   Physical Exam Vitals and nursing note reviewed.  Constitutional:      Appearance: Normal appearance.  HENT:     Head: Normocephalic and atraumatic.     Nose: Nose normal.     Mouth/Throat:     Mouth: Mucous membranes are moist.  Eyes:     Extraocular Movements: Extraocular movements intact.     Conjunctiva/sclera: Conjunctivae normal.  Cardiovascular:     Rate and Rhythm: Normal rate.  Pulmonary:     Effort: Pulmonary effort is normal.     Breath sounds: Normal breath sounds.  Abdominal:     General: Abdomen is flat.     Palpations: Abdomen is soft.     Tenderness: There is no abdominal tenderness.  Musculoskeletal:        General: Tenderness (R lumbar paraspinal muscles) present. No swelling. Normal range of motion.     Cervical back: Neck supple.  Skin:    General: Skin is warm and dry.  Neurological:     General: No focal deficit present.     Mental Status: She is alert.     Sensory: No sensory deficit.     Motor: No weakness.     Gait: Gait normal.  Psychiatric:        Mood and Affect: Mood normal.      ED Results / Procedures / Treatments   Labs (all labs ordered are listed, but only abnormal results are displayed) Labs Reviewed  URINALYSIS, ROUTINE W REFLEX MICROSCOPIC  PREGNANCY, URINE    EKG None  Radiology No results found.  Procedures Procedures  Medications Ordered in the ED Medications  ketorolac (TORADOL) injection 30 mg (has no administration in time  range)     MDM Rules/Calculators/A&P MDM Patient with likely MSK back pain. No red flags. Toradol IM for pain, will check UA given increased frequency.  ED Course  I have reviewed the triage vital signs and the nursing notes.  Pertinent labs & imaging results that were available during my care of the patient were reviewed by me and considered in my medical decision making (see chart for details).  Clinical Course as of Jan 27 849  Fri Jan 28, 2020  0254 HCG neg, UA concerning for infection. Will send for a culture. Plan discharge with oral NSAIDs/muscle relaxer for pain, Abx for UTI and PCP followup.    [CS]    Clinical Course User Index [CS] Pollyann Savoy, MD    Final Clinical Impression(s) / ED Diagnoses Final diagnoses:  None    Rx / DC Orders ED Discharge Orders    None       Pollyann Savoy, MD 01/28/20 [phone]

## 2020-01-30 LAB — URINE CULTURE: Culture: >=100000 — AB

## 2020-01-31 ENCOUNTER — Telehealth: Payer: Self-pay | Admitting: Emergency Medicine

## 2020-01-31 NOTE — Telephone Encounter (Signed)
Post ED Visit - Positive Culture Follow-up  Culture report reviewed by antimicrobial stewardship pharmacist: [facility] []  , Pharm.D. []  Enzo Bi, Pharm.D., BCPS AQ-ID []  , Pharm.D., BCPS []  Celedonio Miyamoto, .D., BCPS []  [place], .D., BCPS, AAHIVP []  Georgina Pillion, Pharm.D., BCPS, AAHIVP []  [address], PharmD, BCPS []  , PharmD, BCPS []  [place], PharmD, BCPS []  [address], PharmD []  , PharmD, BCPS []  Estella Husk, PharmD PharmD  [facility] []  , PharmD []  Phillips Climes, PharmD []  , PharmD []  Agapito Games, Rph []  ) Verlan Friends, PharmD []  , PharmD []  Mervyn Gay, PharmD []  , PharmD []  Vinnie Level, PharmD []  Sharen Hones, PharmD []  [facility], PharmD []  , PharmD []  Len Childs, PharmD   Positive urine culture Treated with cephalexin, organism sensitive to the same and no further patient follow-up is required at this time.  01/31/2020, 12:02 PM

## 2020-09-26 ENCOUNTER — Encounter (HOSPITAL_BASED_OUTPATIENT_CLINIC_OR_DEPARTMENT_OTHER): Payer: Self-pay

## 2020-09-26 ENCOUNTER — Other Ambulatory Visit (HOSPITAL_BASED_OUTPATIENT_CLINIC_OR_DEPARTMENT_OTHER): Payer: Self-pay

## 2020-09-26 ENCOUNTER — Other Ambulatory Visit: Payer: Self-pay

## 2020-09-26 ENCOUNTER — Emergency Department (HOSPITAL_BASED_OUTPATIENT_CLINIC_OR_DEPARTMENT_OTHER)
Admission: EM | Admit: 2020-09-26 | Discharge: 2020-09-26 | Disposition: A | Discharge status: Home / Self Care | Payer: Self-pay | Attending: Emergency Medicine | Admitting: Emergency Medicine

## 2020-09-26 DIAGNOSIS — Z9851 Tubal ligation status: Secondary | ICD-10-CM | POA: Insufficient documentation

## 2020-09-26 DIAGNOSIS — F17290 Nicotine dependence, other tobacco product, uncomplicated: Secondary | ICD-10-CM | POA: Insufficient documentation

## 2020-09-26 DIAGNOSIS — N12 Tubulo-interstitial nephritis, not specified as acute or chronic: Secondary | ICD-10-CM | POA: Insufficient documentation

## 2020-09-26 LAB — URINALYSIS, ROUTINE W REFLEX MICROSCOPIC
Bilirubin Urine: NEGATIVE
Glucose, UA: NEGATIVE mg/dL
Ketones, ur: NEGATIVE mg/dL
Nitrite: POSITIVE — AB
Protein, ur: 100 mg/dL — AB
Specific Gravity, Urine: 1.02 (ref 1.005–1.030)
pH: 7.5 (ref 5.0–8.0)

## 2020-09-26 LAB — URINALYSIS, MICROSCOPIC (REFLEX)

## 2020-09-26 LAB — PREGNANCY, URINE: Preg Test, Ur: NEGATIVE

## 2020-09-26 MED ORDER — ONDANSETRON 4 MG PO TBDP
4.0000 mg | ORAL_TABLET | Freq: Three times a day (TID) | ORAL | 0 refills | Status: AC | PRN
  Filled 2020-09-26: qty 15, 5d supply, fill #0

## 2020-09-26 MED ORDER — PHENAZOPYRIDINE HCL 200 MG PO TABS
200.0000 mg | ORAL_TABLET | Freq: Three times a day (TID) | ORAL | 0 refills | Status: AC
  Filled 2020-09-26: qty 6, 2d supply, fill #0

## 2020-09-26 MED ORDER — KETOROLAC TROMETHAMINE 60 MG/2ML IM SOLN
60.0000 mg | Freq: Once | INTRAMUSCULAR | Status: AC
  Administered 2020-09-26: 60 mg via INTRAMUSCULAR
  Filled 2020-09-26: qty 2

## 2020-09-26 MED ORDER — CEPHALEXIN 500 MG PO CAPS
500.0000 mg | ORAL_CAPSULE | Freq: Three times a day (TID) | ORAL | 0 refills | Status: AC
  Filled 2020-09-26: qty 42, 14d supply, fill #0

## 2020-09-26 NOTE — ED Notes (Signed)
Reports low suprapubic pain worse with urination.  Reports blood in urine since yesterday.

## 2020-09-26 NOTE — ED Triage Notes (Signed)
Complaining of abd pain x3 days, tried over the counter meds with no relief.  Reports blood in urine since yesterday, 09/25/20.

## 2020-09-26 NOTE — ED Notes (Signed)
ED Provider at bedside. 

## 2020-09-26 NOTE — ED Notes (Signed)
Pt ambulated to bathroom with steady gait to provide urine specimen

## 2020-09-26 NOTE — Discharge Instructions (Signed)
I did write you a prescription for nausea medication (just in case) as nausea is often associated with kidney infections.  For pain, you may take Tylenol 1000 mg up to 4 times a day for 1 week. This is the maximum dose of Tylenol (acetaminophen) you can take from all sources. Please check other over-the-counter medications and prescriptions to ensure you are not taking other medications that contain acetaminophen.  You may also take ibuprofen 400 mg 6 times a day alternating with or at the same time as tylenol.  You can take these in addition to the bladder spasm medication prescribed.

## 2020-09-26 NOTE — ED Provider Notes (Signed)
[facility] EMERGENCY DEPARTMENT Provider Note   CSN: [id number] Arrival date & time: 09/26/20  0705     History Chief Complaint  Patient presents with  . Abdominal Pain    Catherine Mosley is a 35 y.o. female.  HPI      35yo female presents with concern for abdominal pain and hematuria.  Abdominal pain has been present for 3 days, hematuria started yesterday.  Describes dysuria, pain at the end of urination which has progressed over the last few days. Now also having right flank pain. 8/10, constant aching.  Tried azo without relief.    No n/v/diarrhea/constipation/vaginal bleeding or discharge. Appetite has been normal. No fevers but has felt chills.  Too uncomfortable to go to work this AM. No hx of kidney stones.   History reviewed. No pertinent past medical history.  There are no problems to display for this patient.   Past Surgical History:  Procedure Laterality Date  . TUBAL LIGATION       OB History    Gravida  1   Para      Term      Preterm      AB      Living        SAB      IAB      Ectopic      Multiple      Live Births              History reviewed. No pertinent family history.  Social History   Tobacco Use  . Smoking status: Current Some Day Smoker    Types: Cigars  . Smokeless tobacco: Never Used  Vaping Use  . Vaping Use: Never used  Substance Use Topics  . Alcohol use: Not Currently    Comment: occ  . Drug use: No    Home Medications Prior to Admission medications   Medication Sig Start Date End Date Taking? Authorizing Provider  cephALEXin (KEFLEX) 500 MG capsule Take 1 capsule (500 mg total) by mouth 3 (three) times daily for 14 days. 09/26/20 10/10/20 Yes Alvira Monday, MD  ondansetron (ZOFRAN ODT) 4 MG disintegrating tablet Take 1 tablet (4 mg total) by mouth every 8 (eight) hours as needed for nausea or vomiting. 09/26/20  Yes Alvira Monday, MD  phenazopyridine (PYRIDIUM) 200 MG tablet Take 1 tablet  (200 mg total) by mouth 3 (three) times daily. 09/26/20  Yes Alvira Monday, MD    Allergies    Patient has no known allergies.  Review of Systems   Review of Systems  Constitutional: Positive for chills. Negative for appetite change and fever.  Respiratory: Negative for shortness of breath.   Gastrointestinal: Positive for abdominal pain. Negative for constipation, diarrhea, nausea and vomiting.  Genitourinary: Positive for dysuria and hematuria. Negative for vaginal bleeding and vaginal discharge.    Physical Exam Updated Vital Signs BP 110/72 (BP Location: Right Arm)   Pulse 73   Temp 97.8 F (36.6 C) (Oral)   Resp 16   Ht 5\' 4"  (1.626 m)   Wt 73.5 kg   LMP 09/21/2020   SpO2 100%   BMI 27.81 kg/m   Physical Exam Vitals and nursing note reviewed.  Constitutional:      General: She is not in acute distress.    Appearance: Normal appearance. She is not ill-appearing, toxic-appearing or diaphoretic.  HENT:     Head: Normocephalic.  Eyes:     Conjunctiva/sclera: Conjunctivae normal.  Cardiovascular:  Rate and Rhythm: Normal rate and regular rhythm.     Pulses: Normal pulses.  Pulmonary:     Effort: Pulmonary effort is normal. No respiratory distress.  Abdominal:     General: Abdomen is flat.     Palpations: Abdomen is soft.     Tenderness: There is abdominal tenderness in the suprapubic area. There is right CVA tenderness. There is no left CVA tenderness. Negative signs include Murphy's sign and McBurney's sign.  Musculoskeletal:        General: No deformity or signs of injury.     Cervical back: No rigidity.  Skin:    General: Skin is warm and dry.     Coloration: Skin is not jaundiced or pale.  Neurological:     General: No focal deficit present.     Mental Status: She is alert and oriented to person, place, and time.     ED Results / Procedures / Treatments   Labs (all labs ordered are listed, but only abnormal results are displayed) Labs Reviewed   URINALYSIS, ROUTINE W REFLEX MICROSCOPIC - Abnormal; Notable for the following components:      Result Value   APPearance CLOUDY (*)    Hgb urine dipstick LARGE (*)    Protein, ur 100 (*)    Nitrite POSITIVE (*)    Leukocytes,Ua MODERATE (*)    All other components within normal limits  URINALYSIS, MICROSCOPIC (REFLEX) - Abnormal; Notable for the following components:   Bacteria, UA MANY (*)    All other components within normal limits  URINE CULTURE  PREGNANCY, URINE    EKG None  Radiology No results found.  Procedures Procedures   Medications Ordered in ED Medications  ketorolac (TORADOL) injection 60 mg (60 mg Intramuscular Given 09/26/20 0750)    ED Course  I have reviewed the triage vital signs and the nursing notes.  Pertinent labs & imaging results that were available during my care of the patient were reviewed by me and considered in my medical decision making (see chart for details).    MDM Rules/Calculators/A&P                          35yo female presents with concern for abdominal pain and hematuria.  DDx includes appendicitis, pancreatitis, cholecystitis, pyelonephritis, nephrolithiasis, diverticulitis, PID, ovarian torsion, ectopic pregnancy, and tuboovarian abscess.  Exam and history are not consistent with torsion, TOA, diverticulitis, appendicitis, pancreatitis, cholecystitis and have low suspicion for PID.   Pregnancy test negative..  Urinalysis shows positive nitrites, many bactery, WBC, and hematuria consistent with a urinary tract infection.  Presentation most consistent with urinary tract infection with pyelonephritis.  Will give rx for antibiotics, pyridium.  At this time given pain does not appear colicky I suspect pyelonephritis without nephrolithiasis, however discussed strict return precautions.  Stable for outpt treatment. Given toradol for pain in ED. Patient discharged in stable condition with understanding of reasons to return.    Final  Clinical Impression(s) / ED Diagnoses Final diagnoses:  Pyelonephritis    Rx / DC Orders ED Discharge Orders         Ordered    cephALEXin (KEFLEX) 500 MG capsule  3 times daily        09/26/20 0847    phenazopyridine (PYRIDIUM) 200 MG tablet  3 times daily        09/26/20 0848    ondansetron (ZOFRAN ODT) 4 MG disintegrating tablet  Every 8 hours PRN  09/26/20 4045           Alvira Monday, MD 09/26/20 [phone]

## 2020-09-28 LAB — URINE CULTURE: Culture: >=100000 — AB

## 2020-09-29 ENCOUNTER — Telehealth: Payer: Self-pay | Admitting: *Deleted

## 2020-09-29 NOTE — Telephone Encounter (Signed)
Post ED Visit - Positive Culture Follow-up: Unsuccessful Patient Follow-up  Culture assessed and recommendations reviewed by:  []  , Pharm.D. []  Enzo Bi, Pharm.D., BCPS AQ-ID []  , Pharm.D., BCPS []  Celedonio Miyamoto, .D., BCPS []  [place], .D., BCPS, AAHIVP []  Georgina Pillion, Pharm.D., BCPS, AAHIVP []  [address], PharmD []  , PharmD, BCPS  Positive urine culture  []  Patient discharged without antimicrobial prescription and treatment is now indicated [x]  Organism is resistant to prescribed ED discharge antimicrobial []  Patient with positive blood cultures  Plan:  Stop Cephalexin.  Start Cipro 500mg  q 12 hrs x 5 days, [place], [place]  Unable to contact patient after 3 attempts, letter will be sent to address on file  [place] 09/29/2020, 9:31 AM

## 2020-10-05 ENCOUNTER — Telehealth: Payer: Self-pay | Admitting: *Deleted

## 2020-10-05 NOTE — Telephone Encounter (Signed)
Post ED Visit - Positive Culture Follow-up: Successful Patient Follow-Up  Culture assessed and recommendations reviewed by:  []  , Pharm.D. []  Enzo Bi, Pharm.D., BCPS AQ-ID []  , Pharm.D., BCPS []  Celedonio Miyamoto, Pharm.D., BCPS []  [place], Garvin Fila.D., BCPS, AAHIVP []  , Pharm.D., BCPS, AAHIVP []  Georgina Pillion, PharmD, BCPS []  , PharmD, BCPS []  [place], PharmD, BCPS []  [place], PharmD  Positive urine culture  []  Patient discharged without antimicrobial prescription and treatment is now indicated [x]  Organism is resistant to prescribed ED discharge antimicrobial []  Patient with positive blood cultures  Changes discussed with ED provider , [place] New antibiotic prescription Cipro 500mg  PO BID x 5 days Called to [place], [facility] [phone]  Contacted patient, date 10/05/20, time 1550   Phillips Climes 10/05/2020, 3:49 PM

## 2020-11-20 ENCOUNTER — Emergency Department (HOSPITAL_BASED_OUTPATIENT_CLINIC_OR_DEPARTMENT_OTHER)
Admission: EM | Admit: 2020-11-20 | Discharge: 2020-11-20 | Disposition: A | Discharge status: Home / Self Care | Payer: Self-pay | Attending: Emergency Medicine | Admitting: Emergency Medicine

## 2020-11-20 ENCOUNTER — Encounter (HOSPITAL_BASED_OUTPATIENT_CLINIC_OR_DEPARTMENT_OTHER): Payer: Self-pay | Admitting: Emergency Medicine

## 2020-11-20 ENCOUNTER — Other Ambulatory Visit: Payer: Self-pay

## 2020-11-20 DIAGNOSIS — R519 Headache, unspecified: Secondary | ICD-10-CM | POA: Insufficient documentation

## 2020-11-20 DIAGNOSIS — F17290 Nicotine dependence, other tobacco product, uncomplicated: Secondary | ICD-10-CM | POA: Insufficient documentation

## 2020-11-20 MED ORDER — METOCLOPRAMIDE HCL 5 MG/ML IJ SOLN
10.0000 mg | Freq: Once | INTRAMUSCULAR | Status: AC
  Administered 2020-11-20: 10 mg via INTRAVENOUS
  Filled 2020-11-20: qty 2

## 2020-11-20 MED ORDER — DIPHENHYDRAMINE HCL 50 MG/ML IJ SOLN
25.0000 mg | Freq: Once | INTRAMUSCULAR | Status: AC
  Administered 2020-11-20: 25 mg via INTRAVENOUS
  Filled 2020-11-20: qty 1

## 2020-11-20 MED ORDER — SODIUM CHLORIDE 0.9 % IV BOLUS
500.0000 mL | Freq: Once | INTRAVENOUS | Status: AC
  Administered 2020-11-20: 500 mL via INTRAVENOUS

## 2020-11-20 NOTE — ED Triage Notes (Signed)
Pt presents to ED Pov. Pt c/o L HA since Saturday. Pt reports that pain if radiating into her ear. No hx of migraines

## 2020-11-20 NOTE — ED Provider Notes (Signed)
[facility] EMERGENCY DEPARTMENT Provider Note   CSN: [id number] Arrival date & time: 11/20/20  2031     History Chief Complaint  Patient presents with   Headache    Catherine Mosley is a 35 y.o. female.  HPI Patient is a 35 year old female who presents to the emergency department with 2 days of waxing and waning headache along the right forehead.  She states her symptoms started after being out in the sun all day at [place] water park.  She has been taking Excedrin with mild short-term relief.  Reports a history of similar headaches in the past.  No visual changes, numbness, tingling, weakness, chest pain, shortness of breath, nausea, vomiting.  No other complaints.    History reviewed. No pertinent past medical history.  There are no problems to display for this patient.   Past Surgical History:  Procedure Laterality Date   TUBAL LIGATION       OB History     Gravida  1   Para      Term      Preterm      AB      Living         SAB      IAB      Ectopic      Multiple      Live Births              History reviewed. No pertinent family history.  Social History   Tobacco Use   Smoking status: Some Days    Pack years: 0.00    Types: Cigars   Smokeless tobacco: Never  Vaping Use   Vaping Use: Never used  Substance Use Topics   Alcohol use: Not Currently    Comment: occ   Drug use: No    Home Medications Prior to Admission medications   Medication Sig Start Date End Date Taking? Authorizing Provider  ondansetron (ZOFRAN ODT) 4 MG disintegrating tablet Take 1 tablet (4 mg total) by mouth every 8 (eight) hours as needed for nausea or vomiting. 09/26/20   Alvira Monday, MD  phenazopyridine (PYRIDIUM) 200 MG tablet Take 1 tablet (200 mg total) by mouth 3 (three) times daily. 09/26/20   Alvira Monday, MD    Allergies    Patient has no known allergies.  Review of Systems   Review of Systems  Eyes:  Negative for visual disturbance.   Respiratory:  Negative for shortness of breath.   Cardiovascular:  Negative for chest pain.  Neurological:  Positive for headaches. Negative for syncope, weakness and numbness.   Physical Exam Updated Vital Signs BP 105/67 (BP Location: Left Arm)   Pulse 69   Temp 98.3 F (36.8 C) (Oral)   Resp 18   Ht 5\' 4"  (1.626 m)   Wt 73 kg   LMP 11/12/2020   SpO2 100%   BMI 27.64 kg/m   Physical Exam Vitals and nursing note reviewed.  Constitutional:      General: She is not in acute distress.    Appearance: Normal appearance. She is well-developed. She is not ill-appearing, toxic-appearing or diaphoretic.  HENT:     Head: Normocephalic and atraumatic.     Right Ear: External ear normal.     Left Ear: External ear normal.     Nose: Nose normal.     Mouth/Throat:     Mouth: Mucous membranes are moist.     Pharynx: Oropharynx is clear. No oropharyngeal exudate or posterior oropharyngeal erythema.  Eyes:     General: No scleral icterus.    Extraocular Movements: Extraocular movements intact.     Right eye: Normal extraocular motion and no nystagmus.     Left eye: Normal extraocular motion and no nystagmus.     Pupils: Pupils are equal, round, and reactive to light. Pupils are equal.     Right eye: Pupil is round and reactive.     Left eye: Pupil is round and reactive.     Comments: PERRL. EOMI.  Cardiovascular:     Rate and Rhythm: Normal rate and regular rhythm.     Pulses: Normal pulses.     Heart sounds: Normal heart sounds. No murmur heard.   No friction rub. No gallop.  Pulmonary:     Effort: Pulmonary effort is normal. No respiratory distress.     Breath sounds: Normal breath sounds. No stridor. No wheezing, rhonchi or rales.  Abdominal:     General: Abdomen is flat.     Tenderness: There is no abdominal tenderness.  Musculoskeletal:        General: Normal range of motion.     Cervical back: Normal range of motion and neck supple. No tenderness.  Skin:    General:  Skin is warm and dry.  Neurological:     General: No focal deficit present.     Mental Status: She is alert and oriented to person, place, and time.     GCS: GCS eye subscore is 4. GCS verbal subscore is 5. GCS motor subscore is 6.     Comments: Patient is oriented to person, place, and time. Patient phonates in clear, complete, and coherent sentences. Strength is 5/5 in all four extremities. Distal sensation intact in all four extremities.  Psychiatric:        Mood and Affect: Mood normal.        Behavior: Behavior normal.   ED Results / Procedures / Treatments   Labs (all labs ordered are listed, but only abnormal results are displayed) Labs Reviewed - No data to display  EKG None  Radiology No results found.  Procedures Procedures   Medications Ordered in ED Medications  sodium chloride 0.9 % bolus 500 mL ( Intravenous Stopped 11/20/20 2320)  metoCLOPramide (REGLAN) injection 10 mg (10 mg Intravenous Given 11/20/20 2236)  diphenhydrAMINE (BENADRYL) injection 25 mg (25 mg Intravenous Given 11/20/20 2235)    ED Course  I have reviewed the triage vital signs and the nursing notes.  Pertinent labs & imaging results that were available during my care of the patient were reviewed by me and considered in my medical decision making (see chart for details).    MDM Rules/Calculators/A&P                          Patient is a 35 year old female who presents to the emergency department with a right frontal headache that started about 2 days ago.  Symptoms have been intermittent.  History of similar symptoms, per patient.  Patient denies any other complaints.  No visual changes, numbness, tingling, weakness, nausea, vomiting.  Physical exam extremely reassuring.  Neurological exam benign.  Did not feel that imaging was warranted and she was agreeable.  Patient given a migraine cocktail with significant relief.  She states she has a ride home.  Feel that she is stable for discharge at  this time and she is agreeable.  Given strict return precautions.  Her questions were answered and she was  amicable at the time of discharge.  Final Clinical Impression(s) / ED Diagnoses Final diagnoses:  Bad headache    Rx / DC Orders ED Discharge Orders     None        Placido Sou, PA-C 11/20/20 2345    Melene Plan, DO 11/22/20 1600

## 2020-11-20 NOTE — Discharge Instructions (Addendum)
Continue to monitor your symptoms closely.  If they worsen, please come back to the emergency department.  It was a pleasure to meet you.

## 2021-02-05 ENCOUNTER — Other Ambulatory Visit (HOSPITAL_BASED_OUTPATIENT_CLINIC_OR_DEPARTMENT_OTHER): Payer: Self-pay

## 2021-02-05 ENCOUNTER — Encounter (HOSPITAL_BASED_OUTPATIENT_CLINIC_OR_DEPARTMENT_OTHER): Payer: Self-pay

## 2021-02-05 ENCOUNTER — Other Ambulatory Visit: Payer: Self-pay

## 2021-02-05 ENCOUNTER — Emergency Department (HOSPITAL_BASED_OUTPATIENT_CLINIC_OR_DEPARTMENT_OTHER)
Admission: EM | Admit: 2021-02-05 | Discharge: 2021-02-05 | Disposition: A | Discharge status: Home / Self Care | Payer: Self-pay | Attending: Emergency Medicine | Admitting: Emergency Medicine

## 2021-02-05 DIAGNOSIS — F17290 Nicotine dependence, other tobacco product, uncomplicated: Secondary | ICD-10-CM | POA: Insufficient documentation

## 2021-02-05 DIAGNOSIS — X58XXXA Exposure to other specified factors, initial encounter: Secondary | ICD-10-CM | POA: Insufficient documentation

## 2021-02-05 DIAGNOSIS — S39012A Strain of muscle, fascia and tendon of lower back, initial encounter: Secondary | ICD-10-CM | POA: Insufficient documentation

## 2021-02-05 DIAGNOSIS — N3000 Acute cystitis without hematuria: Secondary | ICD-10-CM | POA: Insufficient documentation

## 2021-02-05 LAB — PREGNANCY, URINE: Preg Test, Ur: NEGATIVE

## 2021-02-05 LAB — URINALYSIS, MICROSCOPIC (REFLEX)

## 2021-02-05 LAB — URINALYSIS, ROUTINE W REFLEX MICROSCOPIC
Bilirubin Urine: NEGATIVE
Glucose, UA: NEGATIVE mg/dL
Hgb urine dipstick: NEGATIVE
Ketones, ur: NEGATIVE mg/dL
Nitrite: POSITIVE — AB
Protein, ur: NEGATIVE mg/dL
Specific Gravity, Urine: 1.025 (ref 1.005–1.030)
pH: 6.5 (ref 5.0–8.0)

## 2021-02-05 MED ORDER — CEPHALEXIN 500 MG PO CAPS
500.0000 mg | ORAL_CAPSULE | Freq: Four times a day (QID) | ORAL | 0 refills | Status: AC
  Filled 2021-02-05: qty 20, 5d supply, fill #0

## 2021-02-05 MED ORDER — CYCLOBENZAPRINE HCL 10 MG PO TABS
10.0000 mg | ORAL_TABLET | Freq: Once | ORAL | Status: AC
  Administered 2021-02-05: 10 mg via ORAL
  Filled 2021-02-05: qty 1

## 2021-02-05 MED ORDER — KETOROLAC TROMETHAMINE 15 MG/ML IJ SOLN
30.0000 mg | Freq: Once | INTRAMUSCULAR | Status: AC
  Administered 2021-02-05: 30 mg via INTRAMUSCULAR
  Filled 2021-02-05: qty 2

## 2021-02-05 NOTE — ED Triage Notes (Signed)
Pain to left side of back since Friday night, patient states she was reaching for something on a high shelf at [employer] and "felt like I pulled something".  Patient rested, applied heat and took Ibuprofen over the weekend without relief.

## 2021-02-05 NOTE — Discharge Instructions (Addendum)
Recommend NSAIDs for pain control. Keflex for UTI

## 2021-02-05 NOTE — ED Provider Notes (Signed)
[facility] EMERGENCY DEPARTMENT Provider Note   CSN: [id number] Arrival date & time: 02/05/21  0830     History Chief Complaint  Patient presents with   Back Pain    Catherine Mosley is a 35 y.o. female.   Back Pain Associated symptoms: no abdominal pain, no chest pain, no dysuria and no fever    35 year old female presenting to the emergency department with concern for left-sided back pain.  She states that on Friday night she was reaching for something high on a shelf at the supermarket and felt like she pulled something in her left back.  Since then, she has had a sharp, nonradiating pain in her lower back that did not resolve with heating pad and ibuprofen.  She denies any numbness or weakness.  She denies any saddle anesthesias.  She denies any bowel or bladder incontinence.  She denies any fevers or chills or recent IV drug use.  History reviewed. No pertinent past medical history.  There are no problems to display for this patient.   Past Surgical History:  Procedure Laterality Date   TUBAL LIGATION       OB History     Gravida  1   Para      Term      Preterm      AB      Living         SAB      IAB      Ectopic      Multiple      Live Births              History reviewed. No pertinent family history.  Social History   Tobacco Use   Smoking status: Some Days    Types: Cigars   Smokeless tobacco: Never  Vaping Use   Vaping Use: Never used  Substance Use Topics   Alcohol use: Not Currently    Comment: occ   Drug use: No    Home Medications Prior to Admission medications   Medication Sig Start Date End Date Taking? Authorizing Provider  cephALEXin (KEFLEX) 500 MG capsule Take 1 capsule (500 mg total) by mouth 4 (four) times daily. 02/05/21  Yes Ernie Avena, MD  ondansetron (ZOFRAN ODT) 4 MG disintegrating tablet Take 1 tablet (4 mg total) by mouth every 8 (eight) hours as needed for nausea or vomiting. 09/26/20    Alvira Monday, MD  phenazopyridine (PYRIDIUM) 200 MG tablet Take 1 tablet (200 mg total) by mouth 3 (three) times daily. 09/26/20   Alvira Monday, MD    Allergies    Patient has no known allergies.  Review of Systems   Review of Systems  Constitutional:  Negative for chills and fever.  HENT:  Negative for ear pain and sore throat.   Eyes:  Negative for pain and visual disturbance.  Respiratory:  Negative for cough and shortness of breath.   Cardiovascular:  Negative for chest pain and palpitations.  Gastrointestinal:  Negative for abdominal pain and vomiting.  Genitourinary:  Negative for dysuria and hematuria.  Musculoskeletal:  Positive for back pain. Negative for arthralgias.  Skin:  Negative for color change and rash.  Neurological:  Negative for seizures and syncope.  All other systems reviewed and are negative.  Physical Exam Updated Vital Signs BP 105/74 (BP Location: Right Arm)   Pulse 77   Temp 98.4 F (36.9 C) (Oral)   Resp 16   Ht 5\' 4"  (1.626 m)   Wt 73.9  kg   SpO2 100%   BMI 27.98 kg/m   Physical Exam Vitals and nursing note reviewed.  Constitutional:      General: She is not in acute distress.    Appearance: She is well-developed.  HENT:     Head: Normocephalic and atraumatic.  Eyes:     Conjunctiva/sclera: Conjunctivae normal.  Cardiovascular:     Rate and Rhythm: Normal rate and regular rhythm.     Heart sounds: No murmur heard. Pulmonary:     Effort: Pulmonary effort is normal. No respiratory distress.     Breath sounds: Normal breath sounds.  Abdominal:     Palpations: Abdomen is soft.     Tenderness: There is no abdominal tenderness.  Musculoskeletal:     Cervical back: Neck supple.     Comments: No midline tenderness palpation of the cervical, thoracic or lumbar spine.  Soft tissue tenderness of the musculature of the back.  Skin:    General: Skin is warm and dry.  Neurological:     Mental Status: She is alert and oriented to person,  place, and time.     GCS: GCS eye subscore is 4. GCS verbal subscore is 5. GCS motor subscore is 6.     Cranial Nerves: Cranial nerves are intact.     Sensory: Sensation is intact.     Motor: Motor function is intact.     Coordination: Coordination is intact.     Gait: Gait is intact.    ED Results / Procedures / Treatments   Labs (all labs ordered are listed, but only abnormal results are displayed) Labs Reviewed  URINALYSIS, ROUTINE W REFLEX MICROSCOPIC - Abnormal; Notable for the following components:      Result Value   APPearance HAZY (*)    Nitrite POSITIVE (*)    Leukocytes,Ua SMALL (*)    All other components within normal limits  URINALYSIS, MICROSCOPIC (REFLEX) - Abnormal; Notable for the following components:   Bacteria, UA MANY (*)    All other components within normal limits  PREGNANCY, URINE    EKG None  Radiology No results found.  Procedures Procedures   Medications Ordered in ED Medications  ketorolac (TORADOL) 15 MG/ML injection 30 mg (30 mg Intramuscular Given 02/05/21 0947)  cyclobenzaprine (FLEXERIL) tablet 10 mg (10 mg Oral Given 02/05/21 0737)    ED Course  I have reviewed the triage vital signs and the nursing notes.  Pertinent labs & imaging results that were available during my care of the patient were reviewed by me and considered in my medical decision making (see chart for details).    MDM Rules/Calculators/A&P                           Catherine Mosley presents with back pain as per above. I have reviewed the nursing documentation for past medical history, family history, and social history. I have reviewed the EMR and have learned that the patient has a remote history of IV drug use but states that she has not in the past several months..  The patient is able to ambulate and is hemodynamically stable. There are no red flag symptoms. Specifically, she denies: -Being on an anticoagulant or blood thinner -Recently using IV drugs -Having  a history of AAA -Having saddle anesthesia -Having urinary or fecal incontinence -Having any recent falls or trauma  Differential Diagnoses: I do not think that Catherine Mosley is experiencing cauda equina syndrome, abdominal aortic aneurysm,  epidural abscess, aortic dissection, spinal hematoma, nephrolithiasis, spinal metastasis, discitis, or an acute fracture.   The patient is able to ambulate.  Physical exam without midline tenderness, normal neurologic exam.  Of note, the patient had a urinalysis performed in triage that was concerning for possible urinary tract infection.  On reassessment, the patient does endorse slightly increased urinary frequency over the past few days.  We will go ahead and treat with Keflex.  The patient feels that the symptoms were separate from her clear musculoskeletal strain that occurred suddenly on Friday.  While in the ED, I provided the patient with: Medications  ketorolac (TORADOL) 15 MG/ML injection 30 mg (30 mg Intramuscular Given 02/05/21 0947)  cyclobenzaprine (FLEXERIL) tablet 10 mg (10 mg Oral Given 02/05/21 0951)    On reassessment, the patient was stable and well-appearing. This presentation is most consistent with musculoskeletal strain.  Given the patient's reassuring presentation, I believe that she is safe for discharge.  I provided ED return precautions, specifically for the symptoms which are most concerning (e.g., saddle anesthesia, urinary or bowel incontinence or retention, changing or worsening pain), which would necessitate immediate return.  I encouraged the patient to followup with their PCP.  Final Clinical Impression(s) / ED Diagnoses Final diagnoses:  Strain of lumbar region, initial encounter  Acute cystitis without hematuria    Rx / DC Orders ED Discharge Orders          Ordered    cephALEXin (KEFLEX) 500 MG capsule  4 times daily        02/05/21 0942             Ernie Avena, MD 02/05/21 2208

## 2021-02-14 ENCOUNTER — Other Ambulatory Visit (HOSPITAL_BASED_OUTPATIENT_CLINIC_OR_DEPARTMENT_OTHER): Payer: Self-pay

## 2022-04-08 ENCOUNTER — Encounter (HOSPITAL_BASED_OUTPATIENT_CLINIC_OR_DEPARTMENT_OTHER): Payer: Self-pay | Admitting: Emergency Medicine

## 2022-04-08 ENCOUNTER — Emergency Department (HOSPITAL_BASED_OUTPATIENT_CLINIC_OR_DEPARTMENT_OTHER)
Admission: EM | Admit: 2022-04-08 | Discharge: 2022-04-08 | Disposition: A | Discharge status: Home / Self Care | Payer: Medicaid Other | Attending: Emergency Medicine | Admitting: Emergency Medicine

## 2022-04-08 DIAGNOSIS — K05219 Aggressive periodontitis, localized, unspecified severity: Secondary | ICD-10-CM

## 2022-04-08 DIAGNOSIS — K122 Cellulitis and abscess of mouth: Secondary | ICD-10-CM | POA: Diagnosis not present

## 2022-04-08 MED ORDER — LIDOCAINE HCL (PF) 1 % IJ SOLN
5.0000 mL | Freq: Once | INTRAMUSCULAR | Status: AC
  Administered 2022-04-08: 5 mL
  Filled 2022-04-08: qty 5

## 2022-04-08 MED ORDER — AMOXICILLIN-POT CLAVULANATE 875-125 MG PO TABS: 1.0000 | ORAL_TABLET | Freq: Two times a day (BID) | ORAL | 0 refills | Status: AC

## 2022-04-08 NOTE — Discharge Instructions (Signed)
Please take the additional 3 days of Augmentin in addition to what you are provided by urgent care now that the abscess is open and draining appropriately.  I would refrain from wearing your false teeth for at least 5 days to allow abscess to drain.

## 2022-04-08 NOTE — ED Triage Notes (Signed)
Pt reports noticing a bump under her upper lip on Thursday. States she went to the UC and they were concerned for staph infection (but did not test it). Prescribed her amoxicillin. Area has worsened, is more painful and swollen.

## 2022-04-08 NOTE — ED Provider Notes (Signed)
[facility] EMERGENCY DEPARTMENT Provider Note   CSN: [id number] Arrival date & time: 04/08/22  1602     History  Chief Complaint  Patient presents with   Abscess    LIP    Catherine Mosley is a 36 y.o. female with noncontributory past medical history who presents with concern for abscess under the upper lip starting on Thursday.  Patient reports she went to urgent care, they were concerned for a staph infection, did not perform any testing, prescribed her Augmentin.  Patient reports that she has been taking the antibiotics as prescribed, however the fluid collection has increased, more painful, more swollen.  Patient has some false teeth that she wears in the front when she reports that she cannot fit them into place due to discomfort.  She denies any systemic fever, chills.   Abscess      Home Medications Prior to Admission medications   Medication Sig Start Date End Date Taking? Authorizing Provider  amoxicillin-clavulanate (AUGMENTIN) 875-125 MG tablet Take 1 tablet by mouth every 12 (twelve) hours. 04/08/22  Yes Karina Lenderman H, PA-C  cephALEXin (KEFLEX) 500 MG capsule Take 1 capsule (500 mg total) by mouth 4 (four) times daily. 02/05/21   Ernie Avena, MD  ondansetron (ZOFRAN ODT) 4 MG disintegrating tablet Take 1 tablet (4 mg total) by mouth every 8 (eight) hours as needed for nausea or vomiting. 09/26/20   Alvira Monday, MD  phenazopyridine (PYRIDIUM) 200 MG tablet Take 1 tablet (200 mg total) by mouth 3 (three) times daily. 09/26/20   Alvira Monday, MD      Allergies    Patient has no known allergies.    Review of Systems   Review of Systems  Skin:  Positive for wound.  All other systems reviewed and are negative.   Physical Exam Updated Vital Signs BP 127/82 (BP Location: Left Arm)   Pulse 71   Temp (!) 97.1 F (36.2 C)   Resp 17   Ht 5\' 4"  (1.626 m)   Wt 78.5 kg   LMP 03/17/2022 (Exact Date)   SpO2 100%   BMI 29.70 kg/m  Physical  Exam Vitals and nursing note reviewed.  Constitutional:      General: She is not in acute distress.    Appearance: Normal appearance.  HENT:     Head: Normocephalic and atraumatic.     Mouth/Throat:     Comments: Patient does have an approximately 2 x 4 cm fluid collection between the upper lip and anterior gum.  With production of purulent drainage and blood on I&D. Eyes:     General:        Right eye: No discharge.        Left eye: No discharge.  Cardiovascular:     Rate and Rhythm: Normal rate and regular rhythm.  Pulmonary:     Effort: Pulmonary effort is normal. No respiratory distress.  Musculoskeletal:        General: No deformity.  Skin:    General: Skin is warm and dry.  Neurological:     Mental Status: She is alert and oriented to person, place, and time.  Psychiatric:        Mood and Affect: Mood normal.        Behavior: Behavior normal.     ED Results / Procedures / Treatments   Labs (all labs ordered are listed, but only abnormal results are displayed) Labs Reviewed - No data to display  EKG None  Radiology No results  found.  Procedures .Marland KitchenIncision and Drainage  Date/Time: 04/08/2022 5:57 PM  Performed by: Olene Floss, PA-C Authorized by: Olene Floss, PA-C   Consent:    Consent obtained:  Verbal   Consent given by:  Patient   Risks, benefits, and alternatives were discussed: yes     Risks discussed:  Bleeding, incomplete drainage and pain Universal protocol:    Procedure explained and questions answered to patient or proxy's satisfaction: yes     Patient identity confirmed:  Verbally with patient Location:    Type:  Abscess   Size:  2x4cm   Location:  Mouth   Mouth location: alveolar mucosa, midline. Pre-procedure details:    Skin preparation:  Antiseptic wash Sedation:    Sedation type:  None Anesthesia:    Anesthesia method:  Local infiltration   Local anesthetic:  Lidocaine 1% w/o epi Procedure type:    Complexity:   Simple Procedure details:    Incision types:  Single straight   Incision depth:  Submucosal   Wound management:  Probed and deloculated   Drainage:  Purulent and bloody   Drainage amount:  Moderate   Wound treatment:  Wound left open Post-procedure details:    Procedure completion:  Tolerated     Medications Ordered in ED Medications  lidocaine (PF) (XYLOCAINE) 1 % injection 5 mL (5 mLs Infiltration Given by Other 04/08/22 1752)    ED Course/ Medical Decision Making/ A&P                           Medical Decision Making Risk Prescription drug management.   This is an overall well-appearing 36 year old female who presents with concern for questionable abscess of the upper gums in the midline since around Wednesday or Thursday last week.  Patient reports that she saw urgent care, they did not do anything to drain the possible fluid collection and placed her on Augmentin.  Patient reports fluid collection is worsened since then.  Patient denies any systemic fever, chills.  On exam patient does have a 2 x 4 cm abscess in the alveolar gum space in the midline.  I performed drainage of the abscess as described above, was able to drain a moderate amount of bloody, purulent discharge.  We will extend patient's prescription for Augmentin, and encouraged PCP follow-up to ensure resolution.  Patient understands and agrees to plan, and is discharged in stable condition at this time. Final Clinical Impression(s) / ED Diagnoses Final diagnoses:  Abscess of upper gum    Rx / DC Orders ED Discharge Orders          Ordered    amoxicillin-clavulanate (AUGMENTIN) 875-125 MG tablet  Every 12 hours        04/08/22 1801              Olene Floss, PA-C 04/08/22 1805    Jacalyn Lefevre, MD 04/08/22 [phone]
